# Patient Record
Sex: Male | Born: 1970 | Race: White | Hispanic: No | Smoking: Never smoker
Health system: Southern US, Community
[De-identification: ages and names within clinical notes are randomized; demographics above are authoritative.]

## PROBLEM LIST (undated history)

## (undated) DIAGNOSIS — G4730 Sleep apnea, unspecified: Secondary | ICD-10-CM

## (undated) HISTORY — DX: Sleep apnea, unspecified: G47.30

---

## 2021-01-16 ENCOUNTER — Other Ambulatory Visit: Payer: Self-pay

## 2021-01-16 ENCOUNTER — Telehealth (INDEPENDENT_AMBULATORY_CARE_PROVIDER_SITE_OTHER): Payer: 59 | Admitting: Psychiatry

## 2021-01-16 ENCOUNTER — Encounter (HOSPITAL_COMMUNITY): Payer: Self-pay | Admitting: Psychiatry

## 2021-01-16 DIAGNOSIS — F411 Generalized anxiety disorder: Secondary | ICD-10-CM | POA: Diagnosis not present

## 2021-01-16 DIAGNOSIS — F331 Major depressive disorder, recurrent, moderate: Secondary | ICD-10-CM

## 2021-01-16 MED ORDER — SERTRALINE HCL 100 MG PO TABS: ORAL_TABLET | ORAL | 0 refills | Status: DC

## 2021-01-16 NOTE — Progress Notes (Signed)
Psychiatric Initial Adult Assessment   Patient Identification: Theodore [name] MRN:  [id number] Date of Evaluation:  01/16/2021 Referral Source: [facility] Chief Complaint:  establish care, depression Visit Diagnosis:    ICD-10-CM   1. MDD (major depressive disorder), recurrent episode, moderate (HCC)  F33.1   2. GAD (generalized anxiety disorder)  F41.1   Virtual Visit via Video Note  I connected with [name] on 01/16/21 at  9:00 AM EST by a video enabled telemedicine application and verified that I am speaking with the correct person using two identifiers.  Location: Patient: parked car Provider: office   I discussed the limitations of evaluation and management by telemedicine and the availability of in person appointments. The patient expressed understanding and agreed to proceed.   I discussed the assessment and treatment plan with the patient. The patient was provided an opportunity to ask questions and all were answered. The patient agreed with the plan and demonstrated an understanding of the instructions.   The patient was advised to call back or seek an in-person evaluation if the symptoms worsen or if the condition fails to improve as anticipated.  I provided 40  minutes of non-face-to-face time during this encounter.   Thresa Ross, MD   History of Present Illness: Patient is a 50 years old currently single Caucasian male referred by last psychiatrist or [facility] Dr Nila Nephew as he has private insurance now and could not [facility].  He has been diagnosed with depression and anxiety has been on sertraline at a dose of 150 mg.  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  He has worked in the past as a Engineer, mining episodes of depression starting at age 89 or 52 when he was bullied and they have relocated from  [place] to [place] said that he was poor and had a difficult time in school.  He does have anxiety panic attacks when younger.  He has been following up with his psychiatrist and has been doing reasonable with the current medication of sertraline  He does endorse episodes of depression feeling down withdrawn decreased interest in things.  He is trying to be motivated and is going to the gym trying to spend time with his drums and music keeping himself motivated he has also lost more than 10 pounds in the last 2 months by cutting down on his alcohol and working out in the gym Is also noticed memory concerns or sometimes forgets his ways or feels that he has issues recalling.  He has cut down his alcohol intake significantly in the last 2 months.  He plans to follow with the primary care physician and the neurologist.  States his mom has had early dementia. Otherwise he manages his day-to-day routine and things reasonable Anxiety wise he still worries about his future worries about finances worries about still being single he dwells on the past including past memories and traumatizing memories.  He is seeing a therapist at [facility] where he is doing education He does not endorse psychotic symptoms delusions hallucinations.  He is cautious about people and takes time to trust them Does not endorse manic symptoms.  He feels medication sertraline 50 mg during the day and 1 mg does help him he does not need trazodone for sleep he feels medication is keeping some balance  and he is trying to remain motivated to keep himself busy and engaged in activities   Aggravating factor: difficult childood, lost BF when young , lost mom 5 years ago Modifying factor: dad, gym music, drums, music  Duration since young age of 89 - 30   Past psych admission denies   Past Psychiatric History: depression, anxiety  Previous Psychotropic Medications: No   Substance Abuse History in the last 12 months:  Yes.     Consequences of Substance Abuse: have cut down alcohol, understands its effect on mood, memory, depression  Past Medical History: History reviewed. No pertinent past medical history. History reviewed. No pertinent surgical history.  Family Psychiatric History: mom : depression, dementia  Family History: History reviewed. No pertinent family history.  Social History:   Social History   Socioeconomic History  . Marital status: Unknown    Spouse name: Not on file  . Number of children: Not on file  . Years of education: Not on file  . Highest education level: Not on file  Occupational History  . Not on file  Tobacco Use  . Smoking status: Not on file  . Smokeless tobacco: Not on file  Substance and Sexual Activity  . Alcohol use: Not on file  . Drug use: Not on file  . Sexual activity: Not on file  Other Topics Concern  . Not on file  Social History Narrative  . Not on file   Social Determinants of Health   Financial Resource Strain: Not on file  Food Insecurity: Not on file  Transportation Needs: Not on file  Physical Activity: Not on file  Stress: Not on file  Social Connections: Not on file    Additional Social History: grew up with parents, only kid, difficult in school due to bullying and lost friend when young relcoation from [place] growing up.  Allergies:  No Known Allergies  Metabolic Disorder Labs: No results found for: HGBA1C, MPG No results found for: PROLACTIN No results found for: CHOL, TRIG, HDL, CHOLHDL, VLDL, LDLCALC No results found for: TSH  Therapeutic Level Labs: No results found for: LITHIUM No results found for: CBMZ No results found for: VALPROATE  Current Medications: Current Outpatient Medications  Medication Sig Dispense Refill  . traZODone (DESYREL) 50 MG tablet take 1 tablet by oral route  every bedtime as needed for sleep.    . Multiple Vitamin (MULTI-VITAMIN) tablet     . sertraline (ZOLOFT) 100 MG tablet take 1 and 1/2  tablets by oral route  every day 45 tablet 0   No current facility-administered medications for this visit.     Psychiatric Specialty Exam: Review of Systems  Cardiovascular: Negative for chest pain.  Psychiatric/Behavioral: Positive for decreased concentration. Negative for agitation and self-injury.    There were no vitals taken for this visit.There is no height or weight on file to calculate BMI.  General Appearance: Casual  Eye Contact:  Fair  Speech:  Normal Rate  Volume:  Decreased  Mood:  Euthymic  Affect:  Constricted  Thought Process:  Goal Directed  Orientation:  Full (Time, Place, and Person)  Thought Content:  Rumination  Suicidal Thoughts:  No  Homicidal Thoughts:  No  Memory:  Immediate;   Fair Recent;   Fair  Judgement:  Fair  Insight:  Fair  Psychomotor Activity:  Normal  Concentration:  Concentration: Fair and Attention Span: Fair  Recall:  [employer] of Knowledge:Good  Language: Good  Akathisia:  No  Handed:  AIMS (if indicated):  not done  Assets:  Desire for Improvement  ADL's:  Intact  Cognition: WNL  Sleep:  Fair   Screenings: PHQ2-9   Flowsheet Row Video Visit from 01/16/2021 in [facility] AT [place]  PHQ-2 Total Score 1    Flowsheet Row Video Visit from 01/16/2021 in [facility] AT [place]  C-SSRS RISK CATEGORY No Risk      Assessment and Plan: as follows  MDD recurrent moderate :  Continue sertraline at the current dose refill sent  Generalized anxiety disorder; continue sertraline discussed to distract him engage in activities.  He is also seeing a therapist that [facility]  He was will follow up with a neurologist and primary care physician in regards to concern about memory.  He has cut down alcohol and does feel that for the last 1 month he is more alert and has lost weight confidence level is better as well  Follow-up in 3 to 4 weeks or earlier if needed    Thresa Ross,  MD 3/1/20229:44 AM

## 2021-01-19 ENCOUNTER — Ambulatory Visit: Payer: Self-pay | Admitting: Family Medicine

## 2021-01-25 ENCOUNTER — Ambulatory Visit (INDEPENDENT_AMBULATORY_CARE_PROVIDER_SITE_OTHER): Payer: 59 | Admitting: Family Medicine

## 2021-01-25 ENCOUNTER — Encounter: Payer: Self-pay | Admitting: Family Medicine

## 2021-01-25 ENCOUNTER — Other Ambulatory Visit: Payer: Self-pay

## 2021-01-25 VITALS — BP 124/58 | HR 82 | Ht 68.0 in | Wt 191.0 lb

## 2021-01-25 DIAGNOSIS — F419 Anxiety disorder, unspecified: Secondary | ICD-10-CM

## 2021-01-25 DIAGNOSIS — Z23 Encounter for immunization: Secondary | ICD-10-CM

## 2021-01-25 DIAGNOSIS — N528 Other male erectile dysfunction: Secondary | ICD-10-CM

## 2021-01-25 DIAGNOSIS — Z0000 Encounter for general adult medical examination without abnormal findings: Secondary | ICD-10-CM

## 2021-01-25 DIAGNOSIS — Z7689 Persons encountering health services in other specified circumstances: Secondary | ICD-10-CM

## 2021-01-25 DIAGNOSIS — R5383 Other fatigue: Secondary | ICD-10-CM

## 2021-01-25 LAB — POCT GLYCOSYLATED HEMOGLOBIN (HGB A1C): Hemoglobin A1C: 4.8 % (ref 4.0–5.6)

## 2021-01-25 NOTE — Progress Notes (Signed)
    SUBJECTIVE:   CHIEF COMPLAINT / HPI: Establish care  Patient here to establish care.  Indicates has main concern that he is feeling more fatigue and not does not have as much stamina as in previous years.  Indicates he goes to the gym frequently 3-4 times a week.  Indicates trouble establishing a relationship and has some anxiety/depression regarding this.  Currently has psychiatrist and therapist who he indicates are helping manage this.  Discussed concerns with reltionship concerns with therapist.  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  Previously was drinking 6+ beers a night, but has made adjustments in past few months and now only drinks 1-2 beers a night at most in step to becoming healthier.  Denies carvings for more or difficulties with cutting back.  PERTINENT  PMH / PSH:   OBJECTIVE:   BP (!) 124/58   Pulse 82   Ht 5\' 8"  (1.727 m)   Wt 191 lb (86.6 kg)   SpO2 97%   BMI 29.04 kg/m    Physical Exam Constitutional:      General: He is not in acute distress.    Appearance: Normal appearance. He is not ill-appearing.  HENT:     Head: Normocephalic and atraumatic.     Mouth/Throat:     Mouth: Mucous membranes are moist.  Cardiovascular:     Rate and Rhythm: Normal rate and regular rhythm.     Pulses: Normal pulses.  Pulmonary:     Effort: Pulmonary effort is normal.     Breath sounds: Normal breath sounds.  Abdominal:     General: Abdomen is flat.     Palpations: Abdomen is soft.  Skin:    General: Skin is warm.  Neurological:     General: No focal deficit present.     Mental Status: He is alert and oriented to person, place, and time.     ASSESSMENT/PLAN:   Encounter to establish care Patient has not seen doctor in several years.  Plan to obtain labs today. Patient also expresses desire to work on losing weight and becoming healthier.  Praised patient for current major steps taken already including frequent exercise and  decreasing drinking.   - Obtain CMP, Lipid panel, A1C, Hep C, HIV - Give flu vaccine - follow up in 1 month for future health goals  Fatigue Expresses this along with some confusion and erectile dysfunction.  Discussed with patient a component of this was likely due to aging.  May also be due to depression/anxiety history of alcohol use.  Will obtain CBC, TSH, and Testosterone to rule out other medical causes. - Obtain TSH, CBC, and Testosterone - follow up in 1 month    Depression/Anxiety Denies any SI.  Has Therapist and Psychiatrist who he sees for management.  Currently on Zoloft 100 mg daily. - Continue Zoloft 100 mg qd - Continue follow up with Therapist/Psychiatrist  , MD [facility]

## 2021-01-25 NOTE — Patient Instructions (Signed)
It was good to see you today.  Thank you for coming in.  I am getting labs to check your blood sugar, cholesterol, blood levels, thyroid function, electrolytes, hep c, hiv, and testosterone.  I will call you with any abnormal results.  Please get your COVID Moderna booster at a Pharmacy.  I would like to see you back in 1 month.  Be Well, Dr Pecola Leisure

## 2021-01-26 LAB — CBC
Hematocrit: 46.6 % (ref 37.5–51.0)
Hemoglobin: 15.9 g/dL (ref 13.0–17.7)
MCH: 32.5 pg (ref 26.6–33.0)
MCHC: 34.1 g/dL (ref 31.5–35.7)
MCV: 95 fL (ref 79–97)
Platelets: 214 10*3/uL (ref 150–450)
RBC: 4.89 x10E6/uL (ref 4.14–5.80)
RDW: 11.5 % — ABNORMAL LOW (ref 11.6–15.4)
WBC: 6.1 10*3/uL (ref 3.4–10.8)

## 2021-01-26 LAB — LIPID PANEL
Chol/HDL Ratio: 4.2 ratio (ref 0.0–5.0)
Cholesterol, Total: 212 mg/dL — ABNORMAL HIGH (ref 100–199)
HDL: 51 mg/dL (ref >39)
LDL Chol Calc (NIH): 129 mg/dL — ABNORMAL HIGH (ref 0–99)
Triglycerides: 179 mg/dL — ABNORMAL HIGH (ref 0–149)
VLDL Cholesterol Cal: 32 mg/dL (ref 5–40)

## 2021-01-26 LAB — COMPREHENSIVE METABOLIC PANEL
ALT: 30 IU/L (ref 0–44)
AST: 20 IU/L (ref 0–40)
Albumin/Globulin Ratio: 2 (ref 1.2–2.2)
Albumin: 4.5 g/dL (ref 4.0–5.0)
Alkaline Phosphatase: 57 IU/L (ref 44–121)
BUN/Creatinine Ratio: 16 (ref 9–20)
BUN: 14 mg/dL (ref 6–24)
Bilirubin Total: 0.4 mg/dL (ref 0.0–1.2)
CO2: 23 mmol/L (ref 20–29)
Calcium: 9.6 mg/dL (ref 8.7–10.2)
Chloride: 102 mmol/L (ref 96–106)
Creatinine, Ser: 0.89 mg/dL (ref 0.76–1.27)
Globulin, Total: 2.2 g/dL (ref 1.5–4.5)
Glucose: 88 mg/dL (ref 65–99)
Potassium: 4.9 mmol/L (ref 3.5–5.2)
Sodium: 139 mmol/L (ref 134–144)
Total Protein: 6.7 g/dL (ref 6.0–8.5)
eGFR: 106 mL/min/{1.73_m2} (ref >59)

## 2021-01-26 LAB — TSH: TSH: 2.83 u[IU]/mL (ref 0.450–4.500)

## 2021-01-26 LAB — HIV ANTIBODY (ROUTINE TESTING W REFLEX): HIV Screen 4th Generation wRfx: NONREACTIVE

## 2021-01-26 LAB — TESTOSTERONE: Testosterone: 321 ng/dL (ref 264–916)

## 2021-01-29 DIAGNOSIS — R5383 Other fatigue: Secondary | ICD-10-CM | POA: Insufficient documentation

## 2021-01-29 DIAGNOSIS — F419 Anxiety disorder, unspecified: Secondary | ICD-10-CM | POA: Insufficient documentation

## 2021-01-29 DIAGNOSIS — Z7689 Persons encountering health services in other specified circumstances: Secondary | ICD-10-CM | POA: Insufficient documentation

## 2021-01-29 NOTE — Assessment & Plan Note (Signed)
Patient has not seen doctor in several years.  Plan to obtain labs today. Patient also expresses desire to work on losing weight and becoming healthier.  Praised patient for current major steps taken already including frequent exercise and decreasing drinking.   - Obtain CMP, Lipid panel, A1C, Hep C, HIV - Give flu vaccine - follow up in 1 month for future health goals

## 2021-01-29 NOTE — Assessment & Plan Note (Signed)
Expresses this along with some confusion and erectile dysfunction.  Discussed with patient a component of this was likely due to aging.  May also be due to depression/anxiety history of alcohol use.  Will obtain CBC, TSH, and Testosterone to rule out other medical causes. - Obtain TSH, CBC, and Testosterone - follow up in 1 month

## 2021-02-19 ENCOUNTER — Telehealth (INDEPENDENT_AMBULATORY_CARE_PROVIDER_SITE_OTHER): Payer: 59 | Admitting: Psychiatry

## 2021-02-19 ENCOUNTER — Encounter (HOSPITAL_COMMUNITY): Payer: Self-pay | Admitting: Psychiatry

## 2021-02-19 DIAGNOSIS — Z7289 Other problems related to lifestyle: Secondary | ICD-10-CM

## 2021-02-19 DIAGNOSIS — F331 Major depressive disorder, recurrent, moderate: Secondary | ICD-10-CM

## 2021-02-19 DIAGNOSIS — Z789 Other specified health status: Secondary | ICD-10-CM

## 2021-02-19 DIAGNOSIS — F411 Generalized anxiety disorder: Secondary | ICD-10-CM

## 2021-02-19 MED ORDER — TRAZODONE HCL 50 MG PO TABS: ORAL_TABLET | ORAL | 0 refills | Status: DC

## 2021-02-19 MED ORDER — SERTRALINE HCL 100 MG PO TABS: ORAL_TABLET | ORAL | 1 refills | Status: DC

## 2021-02-19 NOTE — Progress Notes (Signed)
[facility] Follow up visit  Patient Identification: Theodore [name] MRN:  [id number] Date of Evaluation:  02/19/2021 Referral Source: [facility] Chief Complaint: follow up depression Visit Diagnosis:    ICD-10-CM   1. MDD (major depressive disorder), recurrent episode, moderate (HCC)  F33.1   2. GAD (generalized anxiety disorder)  F41.1   3. Alcohol use  Z72.89    Virtual Visit via Video Note  I connected with Theodore [name] on 02/19/21 at  8:30 AM EDT by a video enabled telemedicine application and verified that I am speaking with the correct person using two identifiers.  Location: Patient: home Provider: home office   I discussed the limitations of evaluation and management by telemedicine and the availability of in person appointments. The patient expressed understanding and agreed to proceed.     I discussed the assessment and treatment plan with the patient. The patient was provided an opportunity to ask questions and all were answered. The patient agreed with the plan and demonstrated an understanding of the instructions.   The patient was advised to call back or seek an in-person evaluation if the symptoms worsen or if the condition fails to improve as anticipated.  I provided 20 minutes of non-face-to-face time during this encounter, including chart review and documentation     History of Present Illness: Patient is a 50 years old currently single Caucasian male initially referred by last psychiatrist or [facility] Dr Theodore [name] as he has private insurance now and could not [facility].  He has been diagnosed with depression and anxiety has been on sertraline at a dose of 150 mg.   Doing fair on sertraline some foggines wants to lower amount Have cut down alcohol as we discussed its effect on memory  Working and student at [employer]     Aggravating factor: difficult childood, lost BF when young , lost mom 5 years ago Modifying factor: dad, music, drums   Past Psychiatric  History: depression, anxiety  Previous Psychotropic Medications: No   Substance Abuse History in the last 12 months:  Yes.    Consequences of Substance Abuse: have cut down alcohol, understands its effect on mood, memory, depression  Past Medical History: No past medical history on file. No past surgical history on file.  Family Psychiatric History: mom : depression, dementia  Family History: No family history on file.  Social History:   Social History   Socioeconomic History  . Marital status: Unknown    Spouse name: Not on file  . Number of children: Not on file  . Years of education: Not on file  . Highest education level: Not on file  Occupational History  . Not on file  Tobacco Use  . Smoking status: Never Smoker  . Smokeless tobacco: Never Used  Substance and Sexual Activity  . Alcohol use: Not on file  . Drug use: Not on file  . Sexual activity: Not on file  Other Topics Concern  . Not on file  Social History Narrative  . Not on file   Social Determinants of Health   Financial Resource Strain: Not on file  Food Insecurity: Not on file  Transportation Needs: Not on file  Physical Activity: Not on file  Stress: Not on file  Social Connections: Not on file      Allergies:  No Known Allergies  Metabolic Disorder Labs: Lab Results  Component Value Date   HGBA1C 4.8 01/25/2021   No results found for: PROLACTIN Lab Results  Component Value Date  CHOL 212 (H) 01/25/2021   TRIG 179 (H) 01/25/2021   HDL 51 01/25/2021   CHOLHDL 4.2 01/25/2021   LDLCALC 129 (H) 01/25/2021   Lab Results  Component Value Date   TSH 2.830 01/25/2021    Therapeutic Level Labs: No results found for: LITHIUM No results found for: CBMZ No results found for: VALPROATE  Current Medications: Current Outpatient Medications  Medication Sig Dispense Refill  . Multiple Vitamin (MULTI-VITAMIN) tablet     . sertraline (ZOLOFT) 100 MG tablet Take half bid 30 tablet 1  .  traZODone (DESYREL) 50 MG tablet take 1 tablet by oral route  every bedtime as needed for sleep. 30 tablet 0   No current facility-administered medications for this visit.     Psychiatric Specialty Exam: Review of Systems  Cardiovascular: Negative for chest pain.  Psychiatric/Behavioral: Positive for decreased concentration. Negative for agitation and self-injury.    There were no vitals taken for this visit.There is no height or weight on file to calculate BMI.  General Appearance: Casual  Eye Contact:  Fair  Speech:  Normal Rate  Volume:  Decreased  Mood:  Euthymic  Affect:  Constricted  Thought Process:  Goal Directed  Orientation:  Full (Time, Place, and Person)  Thought Content:  Rumination  Suicidal Thoughts:  No  Homicidal Thoughts:  No  Memory:  Immediate;   Fair Recent;   Fair  Judgement:  Fair  Insight:  Fair  Psychomotor Activity:  Normal  Concentration:  Concentration: Fair and Attention Span: Fair  Recall:  [employer] of Knowledge:Good  Language: Good  Akathisia:  No  Handed:    AIMS (if indicated):  not done  Assets:  Desire for Improvement  ADL's:  Intact  Cognition: WNL  Sleep:  Fair   Screenings: PHQ2-9   Flowsheet Row Office Visit from 01/25/2021 in [facility] Video Visit from 01/16/2021 in [facility] AT [place]  PHQ-2 Total Score 4 1  PHQ-9 Total Score 18 --    Flowsheet Row Video Visit from 02/19/2021 in [facility] AT [place] Video Visit from 01/16/2021 in [facility] AT [place]  C-SSRS RISK CATEGORY No Risk No Risk      Assessment and Plan: as follows  MDD recurrent moderate : lower zoloft to 100mg , to see effect on fogginess    Generalized anxiety disorder; also sees therapist at [facility], lower zoloft to 100mg  Discussed to abstain from alcohol and its effect on memory   Fu 6 - 8w     , MD 4/4/20228:47 AM

## 2021-04-10 ENCOUNTER — Telehealth (HOSPITAL_COMMUNITY): Payer: Self-pay

## 2021-04-10 NOTE — Telephone Encounter (Signed)
left for patient that this nurse was trying to call them back after message left. Requested he call our office back to follow up on any concerns.

## 2021-04-10 NOTE — Telephone Encounter (Signed)
Medication management - Telephone call with patient as he was requesting a refill of Sertraline.  Informed patient a new order was e-scribed into his [facility] on 02/19/21 + 1 refill.  Collateral to call back if they do not have the order as he admitted he called in a refill off of an expired bottle order.

## 2021-04-23 ENCOUNTER — Encounter (HOSPITAL_COMMUNITY): Payer: Self-pay | Admitting: Psychiatry

## 2021-04-23 ENCOUNTER — Telehealth (INDEPENDENT_AMBULATORY_CARE_PROVIDER_SITE_OTHER): Payer: 59 | Admitting: Psychiatry

## 2021-04-23 DIAGNOSIS — F331 Major depressive disorder, recurrent, moderate: Secondary | ICD-10-CM

## 2021-04-23 DIAGNOSIS — F411 Generalized anxiety disorder: Secondary | ICD-10-CM | POA: Diagnosis not present

## 2021-04-23 MED ORDER — SERTRALINE HCL 100 MG PO TABS: ORAL_TABLET | ORAL | 1 refills | Status: DC

## 2021-04-23 NOTE — Progress Notes (Signed)
[facility] Follow up visit  Patient Identification: Theodore [name] MRN:  [id number] Date of Evaluation:  04/23/2021 Referral Source: [facility] Chief Complaint: follow up depression Visit Diagnosis:    ICD-10-CM   1. MDD (major depressive disorder), recurrent episode, moderate (HCC)  F33.1   2. GAD (generalized anxiety disorder)  F41.1    Virtual Visit via Video Note  I connected with Theodore [name] on 04/23/21 at 10:30 AM EDT by a video enabled telemedicine application and verified that I am speaking with the correct person using two identifiers.  Location: Patient: home Provider: home office   I discussed the limitations of evaluation and management by telemedicine and the availability of in person appointments. The patient expressed understanding and agreed to proceed.     I discussed the assessment and treatment plan with the patient. The patient was provided an opportunity to ask questions and all were answered. The patient agreed with the plan and demonstrated an understanding of the instructions.   The patient was advised to call back or seek an in-person evaluation if the symptoms worsen or if the condition fails to improve as anticipated.  I provided 15  minutes of non-face-to-face time during this encounter.       History of Present Illness: Patient is a 50  years old currently single Caucasian male initially referred by last psychiatrist or [facility] Nila Nephew as he has private insurance now and could not [facility].  He has been diagnosed with depression and anxiety has been on sertraline at a dose of 150 mg.    Doing fair on sertraline now at 100mg  He tries to help out his dad who was sick He keeps himself busy with his mid eardrum room and plays music Was having fogginess on sertraline he stopped it and felt that he needed to get back because of depression or mood symptoms came back Anxiety is also manageable now better on the 100 mg he seldom takes trazodone at  night  He has cut down alcohol and substituting with water and green tea that is helping   Working and at Consulting civil [employer]     Aggravating factor: difficult childood, lost BF when young , lost mom 5 years ago Modifying factor: Dad, music, grams   Past Psychiatric History: depression, anxiety  Previous Psychotropic Medications: No   Substance Abuse History in the last 12 months:  Yes.    Consequences of Substance Abuse: have cut down alcohol, understands its effect on mood, memory, depression  Past Medical History: History reviewed. No pertinent past medical history. History reviewed. No pertinent surgical history.  Family Psychiatric History: mom : depression, dementia  Family History: History reviewed. No pertinent family history.  Social History:   Social History   Socioeconomic History  . Marital status: Unknown    Spouse name: Not on file  . Number of children: Not on file  . Years of education: Not on file  . Highest education level: Not on file  Occupational History  . Not on file  Tobacco Use  . Smoking status: Never Smoker  . Smokeless tobacco: Never Used  Substance and Sexual Activity  . Alcohol use: Not on file  . Drug use: Not on file  . Sexual activity: Not on file  Other Topics Concern  . Not on file  Social History Narrative  . Not on file   Social Determinants of Health   Financial Resource Strain: Not on file  Food Insecurity: Not on file  Transportation  Needs: Not on file  Physical Activity: Not on file  Stress: Not on file  Social Connections: Not on file      Allergies:  No Known Allergies  Metabolic Disorder Labs: Lab Results  Component Value Date   HGBA1C 4.8 01/25/2021   No results found for: PROLACTIN Lab Results  Component Value Date   CHOL 212 (H) 01/25/2021   TRIG 179 (H) 01/25/2021   HDL 51 01/25/2021   CHOLHDL 4.2 01/25/2021   LDLCALC 129 (H) 01/25/2021   Lab Results  Component Value Date   TSH 2.830 01/25/2021     Therapeutic Level Labs: No results found for: LITHIUM No results found for: CBMZ No results found for: VALPROATE  Current Medications: Current Outpatient Medications  Medication Sig Dispense Refill  . Multiple Vitamin (MULTI-VITAMIN) tablet     . sertraline (ZOLOFT) 100 MG tablet Take half bid 30 tablet 1  . traZODone (DESYREL) 50 MG tablet take 1 tablet by oral route  every bedtime as needed for sleep. 30 tablet 0   No current facility-administered medications for this visit.     Psychiatric Specialty Exam: Review of Systems  Cardiovascular: Negative for chest pain.  Psychiatric/Behavioral: Negative for agitation and self-injury.    There were no vitals taken for this visit.There is no height or weight on file to calculate BMI.  General Appearance: Casual  Eye Contact:  Fair  Speech:  Normal Rate  Volume:  Decreased  Mood:  Euthymic  Affect:  Constricted  Thought Process:  Goal Directed  Orientation:  Full (Time, Place, and Person)  Thought Content:  Rumination  Suicidal Thoughts:  No  Homicidal Thoughts:  No  Memory:  Immediate;   Fair Recent;   Fair  Judgement:  Fair  Insight:  Fair  Psychomotor Activity:  Normal  Concentration:  Concentration: Fair and Attention Span: Fair  Recall:  [employer] of Knowledge:Good  Language: Good  Akathisia:  No  Handed:    AIMS (if indicated):  not done  Assets:  Desire for Improvement  ADL's:  Intact  Cognition: WNL  Sleep:  Fair   Screenings: PHQ2-9   Flowsheet Row Office Visit from 01/25/2021 in [facility] Video Visit from 01/16/2021 in [facility] AT [place]  PHQ-2 Total Score 4 1  PHQ-9 Total Score 18 --    Flowsheet Row Video Visit from 04/23/2021 in [facility] AT [place] Video Visit from 02/19/2021 in [facility] AT [place] Video Visit from 01/16/2021 in [facility] CENTER AT [place]   C-SSRS RISK CATEGORY No Risk No Risk No Risk      Assessment and Plan: as follows  MDD recurrent moderate : Doing fair on 100 mg sertraline     Generalized anxiety disorder; manageable on sertraline working on healthy habits like drinking green tea and water instead of alcohol Seldom takes trazodone anymore   Fu 6 - 8w     Thresa Ross, MD 6/6/202210:45 AM

## 2021-06-04 ENCOUNTER — Ambulatory Visit (INDEPENDENT_AMBULATORY_CARE_PROVIDER_SITE_OTHER): Payer: 59 | Admitting: Family Medicine

## 2021-06-04 ENCOUNTER — Other Ambulatory Visit: Payer: Self-pay

## 2021-06-04 ENCOUNTER — Encounter: Payer: Self-pay | Admitting: Family Medicine

## 2021-06-04 VITALS — BP 120/64 | HR 81 | Ht 68.0 in | Wt 197.1 lb

## 2021-06-04 DIAGNOSIS — E785 Hyperlipidemia, unspecified: Secondary | ICD-10-CM

## 2021-06-04 DIAGNOSIS — R5383 Other fatigue: Secondary | ICD-10-CM | POA: Diagnosis not present

## 2021-06-04 DIAGNOSIS — E1169 Type 2 diabetes mellitus with other specified complication: Secondary | ICD-10-CM

## 2021-06-04 DIAGNOSIS — F419 Anxiety disorder, unspecified: Secondary | ICD-10-CM | POA: Diagnosis not present

## 2021-06-04 DIAGNOSIS — G4739 Other sleep apnea: Secondary | ICD-10-CM

## 2021-06-04 MED ORDER — MELATONIN 3 MG PO TABS: 3.0000 mg | ORAL_TABLET | Freq: Every day | ORAL | 0 refills | Status: DC

## 2021-06-04 NOTE — Patient Instructions (Addendum)
It was good to see you today.  Thank you for coming in.  I think you may have sleep apnea.  I placed a referral for a sleep study.  They should reach out to you in 1-2 weeks for this study.  I also recommend a Melatonin supplement before bed.  Please continue to take Zoloft 150 mg and follow-up with your therapist and psychiatrist.   Be Well, Dr Pecola Leisure

## 2021-06-05 DIAGNOSIS — E1169 Type 2 diabetes mellitus with other specified complication: Secondary | ICD-10-CM | POA: Insufficient documentation

## 2021-06-05 NOTE — Assessment & Plan Note (Signed)
Sees psychiatrist and therapist for regularly. - Continue Zoloft 150 mg

## 2021-06-05 NOTE — Assessment & Plan Note (Signed)
LDL elevated at 130.  Given patient's normal blood pressure and normal A1C, do not feel statin therpay is indicated.  Continue to work on [employer] and exercise. - Repeat lipid panel in 1 year

## 2021-06-05 NOTE — Progress Notes (Signed)
    SUBJECTIVE:   CHIEF COMPLAINT / HPI:   Patient having issues with sleep.  Also wakes up feeling fatigued and like he has not had good night sleep.  Seen previously for previous problem and lab work returned normal.  Feel like has gotten worse since then.  Also recently spoke to psychiatrist who increased Zoloft to 150 mg.  Indicates has been told he snores very loudly before.  PERTINENT  PMH / PSH: Hyperlipidemia  OBJECTIVE:   BP 120/64   Pulse 81   Ht 5\' 8"  (1.727 m)   Wt 197 lb 2 oz (89.4 kg)   SpO2 98%   BMI 29.97 kg/m    Physical Exam Constitutional:      Appearance: Normal appearance.  HENT:     Mouth/Throat:     Mouth: Mucous membranes are moist.  Cardiovascular:     Rate and Rhythm: Normal rate and regular rhythm.  Pulmonary:     Effort: Pulmonary effort is normal.  Abdominal:     General: Abdomen is flat. Bowel sounds are normal.     Palpations: Abdomen is soft.  Skin:    General: Skin is warm.  Neurological:     General: No focal deficit present.     Mental Status: He is alert.     ASSESSMENT/PLAN:   Fatigue TSH, Hgb, Testosterone all normal.  Given issues with sleep and history of loud snoring, do think sleep study would be appropriate.  Could also be due to anxiety.  Has therapist and psychiatrist who he sees regularly and recently increased to 150 mg Zoloft. - Referral placed for sleep study - Melatonin 3 mg qhs - follow-up as needed  Anxiety Sees psychiatrist and therapist for regularly. - Continue Zoloft 150 mg  Hyperlipidemia associated with type 2 diabetes mellitus (HCC) LDL elevated at 130.  Given patient's normal blood pressure and normal A1C, do not feel statin therpay is indicated.  Continue to work on and exercise. - Repeat lipid panel in 1 year     [employer], MD [facility]

## 2021-06-05 NOTE — Assessment & Plan Note (Addendum)
TSH, Hgb, Testosterone all normal.  Given issues with sleep and history of loud snoring, do think sleep study would be appropriate.  Could also be due to anxiety.  Has therapist and psychiatrist who he sees regularly and recently increased to 150 mg Zoloft. - Referral placed for sleep study - Melatonin 3 mg qhs - follow-up as needed

## 2021-06-07 NOTE — Addendum Note (Signed)
Addended by: Jovita Kussmaul on: 06/07/2021 02:32 PM   Modules accepted: Orders

## 2021-07-02 ENCOUNTER — Telehealth (HOSPITAL_COMMUNITY): Payer: 59 | Admitting: Psychiatry

## 2021-08-03 ENCOUNTER — Other Ambulatory Visit (HOSPITAL_COMMUNITY): Payer: Self-pay | Admitting: Psychiatry

## 2021-08-20 ENCOUNTER — Encounter: Payer: Self-pay | Admitting: Student

## 2021-08-27 ENCOUNTER — Other Ambulatory Visit (HOSPITAL_COMMUNITY): Payer: Self-pay

## 2021-08-27 ENCOUNTER — Other Ambulatory Visit: Payer: Self-pay

## 2021-08-27 ENCOUNTER — Other Ambulatory Visit (HOSPITAL_COMMUNITY): Payer: Self-pay | Admitting: Psychiatry

## 2021-08-27 ENCOUNTER — Ambulatory Visit (HOSPITAL_BASED_OUTPATIENT_CLINIC_OR_DEPARTMENT_OTHER): Payer: 59 | Attending: Family Medicine | Admitting: Internal Medicine

## 2021-08-27 VITALS — Ht 68.0 in | Wt 190.0 lb

## 2021-08-27 DIAGNOSIS — G4733 Obstructive sleep apnea (adult) (pediatric): Secondary | ICD-10-CM | POA: Diagnosis not present

## 2021-08-27 DIAGNOSIS — G4739 Other sleep apnea: Secondary | ICD-10-CM | POA: Diagnosis present

## 2021-08-27 MED ORDER — SERTRALINE HCL 100 MG PO TABS: ORAL_TABLET | ORAL | 0 refills | Status: DC

## 2021-08-28 ENCOUNTER — Telehealth (INDEPENDENT_AMBULATORY_CARE_PROVIDER_SITE_OTHER): Payer: 59 | Admitting: Psychiatry

## 2021-08-28 ENCOUNTER — Encounter (HOSPITAL_COMMUNITY): Payer: Self-pay | Admitting: Psychiatry

## 2021-08-28 DIAGNOSIS — F1090 Alcohol use, unspecified, uncomplicated: Secondary | ICD-10-CM

## 2021-08-28 DIAGNOSIS — F331 Major depressive disorder, recurrent, moderate: Secondary | ICD-10-CM

## 2021-08-28 DIAGNOSIS — Z789 Other specified health status: Secondary | ICD-10-CM

## 2021-08-28 DIAGNOSIS — F411 Generalized anxiety disorder: Secondary | ICD-10-CM

## 2021-08-28 NOTE — Progress Notes (Signed)
[facility] Follow up visit  Patient Identification: Theodore [name] MRN:  [id number] Date of Evaluation:  08/28/2021 Referral Source: [facility] Chief Complaint: follow up depression Visit Diagnosis:    ICD-10-CM   1. MDD (major depressive disorder), recurrent episode, moderate (HCC)  F33.1     2. GAD (generalized anxiety disorder)  F41.1     3. Alcohol use  Z78.9      Virtual Visit via Video Note  I connected with Theodore [name] on 08/28/21 at  3:00 PM EDT by a video enabled telemedicine application and verified that I am speaking with the correct person using two identifiers.  Location: Patient: home Provider: office   I discussed the limitations of evaluation and management by telemedicine and the availability of in person appointments. The patient expressed understanding and agreed to proceed.     I discussed the assessment and treatment plan with the patient. The patient was provided an opportunity to ask questions and all were answered. The patient agreed with the plan and demonstrated an understanding of the instructions.   The patient was advised to call back or seek an in-person evaluation if the symptoms worsen or if the condition fails to improve as anticipated.  I provided 12 minutes of non-face-to-face time during this encounter.      History of Present Illness: Patient is a 50  years old currently single Caucasian male initially referred by last psychiatrist or [facility] Dr Theodore [name] as he has private insurance now and [facility].  He has been diagnosed with depression and anxiety has been on sertraline at a dose of 150 mg.    Keeping busy with music and gigs. Takes care of father Some increase stressors but handling it Got cpap study done and pending machine use  Takes trazadone at night prn  Says has cut down alcohol and not using it any more   Working and Consulting civil engineer at [employer]   Aggravating factor: difficult childood, lost BF when young , lost mom 5  years ago Modifying factor: Dad, music, drums   Past Psychiatric History: depression, anxiety  Previous Psychotropic Medications: No   Substance Abuse History in the last 12 months:  Yes.    Consequences of Substance Abuse: have cut down alcohol, understands its effect on mood, memory, depression  Past Medical History: History reviewed. No pertinent past medical history. History reviewed. No pertinent surgical history.  Family Psychiatric History: mom : depression, dementia  Family History: History reviewed. No pertinent family history.  Social History:   Social History   Socioeconomic History   Marital status: Unknown    Spouse name: Not on file   Number of children: Not on file   Years of education: Not on file   Highest education level: Not on file  Occupational History   Not on file  Tobacco Use   Smoking status: Never   Smokeless tobacco: Never  Substance and Sexual Activity   Alcohol use: Not on file   Drug use: Not on file   Sexual activity: Not on file  Other Topics Concern   Not on file  Social History Narrative   Not on file   Social Determinants of Health   Financial Resource Strain: Not on file  Food Insecurity: Not on file  Transportation Needs: Not on file  Physical Activity: Not on file  Stress: Not on file  Social Connections: Not on file      Allergies:  No Known Allergies  Metabolic Disorder Labs: Lab  Results  Component Value Date   HGBA1C 4.8 01/25/2021   No results found for: PROLACTIN Lab Results  Component Value Date   CHOL 212 (H) 01/25/2021   TRIG 179 (H) 01/25/2021   HDL 51 01/25/2021   CHOLHDL 4.2 01/25/2021   LDLCALC 129 (H) 01/25/2021   Lab Results  Component Value Date   TSH 2.830 01/25/2021    Therapeutic Level Labs: No results found for: LITHIUM No results found for: CBMZ No results found for: VALPROATE  Current Medications: Current Outpatient Medications  Medication Sig Dispense Refill   melatonin 3 MG  TABS tablet Take 1 tablet (3 mg total) by mouth at bedtime.  0   Multiple Vitamin (MULTI-VITAMIN) tablet      sertraline (ZOLOFT) 100 MG tablet Take half bid 30 tablet 0   traZODone (DESYREL) 50 MG tablet take 1 tablet by oral route  every bedtime as needed for sleep. 30 tablet 0   No current facility-administered medications for this visit.     Psychiatric Specialty Exam: Review of Systems  Cardiovascular:  Negative for chest pain.  Psychiatric/Behavioral:  Negative for agitation and self-injury.    There were no vitals taken for this visit.There is no height or weight on file to calculate BMI.  General Appearance: Casual  Eye Contact:  Fair  Speech:  Normal Rate  Volume:  Decreased  Mood:  Euthymic  Affect:  Constricted  Thought Process:  Goal Directed  Orientation:  Full (Time, Place, and Person)  Thought Content:  Rumination  Suicidal Thoughts:  No  Homicidal Thoughts:  No  Memory:  Immediate;   Fair Recent;   Fair  Judgement:  Fair  Insight:  Fair  Psychomotor Activity:  Normal  Concentration:  Concentration: Fair and Attention Span: Fair  Recall:  [employer] of Knowledge:Good  Language: Good  Akathisia:  No  Handed:    AIMS (if indicated):  not done  Assets:  Desire for Improvement  ADL's:  Intact  Cognition: WNL  Sleep:  Fair   Screenings: PHQ2-9    Flowsheet Row Office Visit from 01/25/2021 in [facility] Video Visit from 01/16/2021 in BE[facility] AT [place]  PHQ-2 Total Score 4 1  PHQ-9 Total Score 18 --      Flowsheet Row Video Visit from 08/28/2021 in [facility] AT [place] Video Visit from 04/23/2021 in [facility] AT [place] Video Visit from 02/19/2021 in BE[facility] AT [place]  C-SSRS RISK CATEGORY No Risk No Risk No Risk       Assessment and Plan: as follows Prior documentation reviewed  MDD recurrent moderate :  somewhat subdued, decrease energy but wants to wait till uses copap machine and see effect Continue 100mg  zoloft , not increase today     Generalized anxiety disorder; manageable, continue zoloft Trazadone at night for sleep Will evaluate after cpap use and its effect as well  Fu 6 - 8w     , MD 10/11/20223:11 PM

## 2021-08-29 ENCOUNTER — Other Ambulatory Visit: Payer: Self-pay

## 2021-09-08 ENCOUNTER — Encounter: Payer: Self-pay | Admitting: Student

## 2021-09-08 DIAGNOSIS — G4739 Other sleep apnea: Secondary | ICD-10-CM

## 2021-09-08 NOTE — Procedures (Signed)
Patient Name: Theodore, [name] Date: 08/27/2021 Gender: Male D.O.B: [birth year] Age (years): 50 Referring Provider: Madison [name] Height (inches): 68 Interpreting Physician: Theodore Lyons MD, ABSM Weight (lbs): 190 RPSGT: Theodore [name] BMI: 29 MRN: [id number] Neck Size: 15.50  CLINICAL INFORMATION The patient is referred for a split night study with BPAP. MEDICATIONS Medications self-administered by patient taken the night of the study :   SLEEP STUDY TECHNIQUE As per the AASM Manual for the Scoring of Sleep and Associated Events v2.3 (April 2016) with a hypopnea requiring 4% desaturations.  The channels recorded and monitored were frontal, central and occipital EEG, electrooculogram (EOG), submentalis EMG (chin), nasal and oral airflow, thoracic and abdominal wall motion, anterior tibialis EMG, snore microphone, electrocardiogram, and pulse oximetry. Bi-level positive airway pressure (BiPAP) was initiated when the patient met split night criteria and was titrated according to treat sleep-disordered breathing.  RESPIRATORY PARAMETERS Diagnostic  Total AHI (/hr): 76.2 RDI (/hr): 95.9 OA Index (/hr): 0.9 CA Index (/hr): 0.0 REM AHI (/hr): 94.7 NREM AHI (/hr): 74.8 Supine AHI (/hr): 95.1 Non-supine AHI (/hr): 84.25 Min O2 Sat (%): 77.0 Mean O2 (%): 92.1 Time below 88% (min): 7   Titration  Optimal IPAP Pressure (cm): 25 Optimal EPAP Pressure (cm): 21 AHI at Optimal Pressure (/hr): 0 Min O2 at Optimal Pressure (%): 93.0 Sleep % at Optimal (%): 18 Supine % at Optimal (%): 0   SLEEP ARCHITECTURE The study was initiated at 10:11:27 PM and terminated at 5:41:04 AM. The total recorded time was 449.6 minutes. EEG confirmed total sleep time was 319 minutes yielding a sleep efficiency of 70.9%%. Sleep onset after lights out was 32.7 minutes with a REM latency of 120.0 minutes. The patient spent 10.0%% of the night in stage N1 sleep, 82.6%% in stage N2 sleep, 0.0%% in stage N3 and  7.4% in REM. Wake after sleep onset (WASO) was 97.9 minutes. The Arousal Index was 59.2/hour.  LEG MOVEMENT DATA The total Periodic Limb Movements of Sleep (PLMS) were 0. The PLMS index was 0.0 .  CARDIAC DATA The 2 lead EKG demonstrated sinus rhythm. The mean heart rate was 100.0 beats per minute. Other EKG findings include: PVCs.  IMPRESSIONS - Severe obstructive sleep apnea occurred during the diagnostic portion of the study (AHI = 76.2 /hour). - CPAP provided inadequate control at tolerated pressure and was changed to bilevel (BIPAP) titration. An optimal BIPAP pressure was selected for this patient ( 25 / 21cm of water) - No significant central sleep apnea occurred during the diagnostic portion of the study (CAI = 0.0/hour). - Oxygen desaturation was noted during the diagnostic portion of the study (Min O2 = 77.0%). MinimumO2 saturation on BIPAP 25/21 was 93%. - The patient snored with soft snoring volume during the diagnostic portion of the study. - EKG findings include PVCs, PACs. - Clinically significant periodic limb movements of sleep did not occur during the study.  DIAGNOSIS - Obstructive Sleep Apnea (G47.33)  RECOMMENDATIONS - Trial of BiPAP therapy on 25/21 cm H2O, PS 3 or autoBIPAP 15-25.  - Patient used a Medium size Resmed Full Face Mask AirFit F20 mask and heated humidification. - Be careful with alcohol, sedatives and other CNS depressants that may worsen sleep apnea and disrupt normal sleep architecture. - Sleep hygiene should be reviewed to assess factors that may improve sleep quality. - Weight management and regular exercise should be initiated or continued.  [Electronically signed] 09/08/2021 10:00 AM  Theodore Lyons MD, ABSM Diplomate, American Board of Sleep Medicine  NPI: [id number]                          [place], American Board of Sleep Medicine  ELECTRONICALLY SIGNED ON:  09/08/2021, 9:53 AM [place] PH: [phone]   FX: [phone] [place]

## 2021-09-10 NOTE — Telephone Encounter (Signed)
Orders for DME BiPAP orders sent. May need prescription printed and patient may pick up. Thanks

## 2021-09-14 NOTE — Telephone Encounter (Signed)
Community message sent to [employer] for processing.  

## 2021-09-17 ENCOUNTER — Encounter: Payer: Self-pay | Admitting: Student

## 2021-09-25 ENCOUNTER — Other Ambulatory Visit (HOSPITAL_COMMUNITY): Payer: Self-pay | Admitting: Psychiatry

## 2021-09-28 ENCOUNTER — Encounter: Payer: Self-pay | Admitting: Student

## 2021-09-28 ENCOUNTER — Other Ambulatory Visit: Payer: Self-pay

## 2021-09-28 ENCOUNTER — Ambulatory Visit (INDEPENDENT_AMBULATORY_CARE_PROVIDER_SITE_OTHER): Payer: 59 | Admitting: Student

## 2021-09-28 VITALS — BP 125/79 | HR 93 | Wt 187.6 lb

## 2021-09-28 DIAGNOSIS — R4189 Other symptoms and signs involving cognitive functions and awareness: Secondary | ICD-10-CM | POA: Diagnosis not present

## 2021-09-28 DIAGNOSIS — L649 Androgenic alopecia, unspecified: Secondary | ICD-10-CM | POA: Diagnosis not present

## 2021-09-28 MED ORDER — FINASTERIDE 1 MG PO TABS: 1.0000 mg | ORAL_TABLET | Freq: Every day | ORAL | 0 refills | Status: DC

## 2021-09-28 NOTE — Patient Instructions (Signed)
It was great meeting you!  I have sent "Propecia" into your pharmacy. Take 1 tablet daily.  Follow up in 3 months, or sooner if needed.  Best wishes, Darral Dash

## 2021-09-28 NOTE — Progress Notes (Signed)
    SUBJECTIVE:   CHIEF COMPLAINT / HPI:   Hair loss Pt reports worsening hair loss since having COVID-19 infection. No itching of the scalp, flakiness, or other symptoms. He is requesting Propecia. Takes OTC hair loss herbal supplements that have biotin, B vitmains and other ingredients. Also uses topical Rogaine and shampoo.  Brain fog Says he has had brain fog since COVID-19 infection, which he attributes his symptoms to. Says he will have periods of "spacing out" and forgetfulness. No headache, weakness, dizziness, or other symptoms. 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  OSA Recently had sleep study and prescription for CPAP nightly. Has yet to receive supplies, said they will come in 7-10 days.  PERTINENT  PMH / PSH: Hyperlipidemia, OSA  OBJECTIVE:   BP 125/79   Pulse 93   Wt 187 lb 9.6 oz (85.1 kg)   SpO2 97%   BMI 28.52 kg/m   General: Well-appearing, pleasant male in no distress Scalp: Diffuse hair loss over anterior and vertex scalp. Skin without erythema, flaking, rash. Respiratory: Normal work of breathing Neuro: Awake, alert and oriented x4. Speech clear and fluent. Answers questions appropriately. No focal neurologic deficits. 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 No concern for scalp infection/fungal infection leading to hair loss- he denies pain, flaking, burning.  - Prescribed Propecia 1mg  daily, will assess efficacy in 3 months  Brain fog OSA is likely contributor as he has yet to start CPAP nightly treatment. No concern for autoimmune condition, hormone imbalance, TIA/CVA, other neurologic disorder. Will continue to monitor as he remains largely asymptomatic otherwise and is functioning normally at his job and school, although he does struggle to  focus at times.     , DO [facility]

## 2021-09-28 NOTE — Assessment & Plan Note (Signed)
OSA is likely contributor as he has yet to start CPAP nightly treatment. No concern for autoimmune condition, hormone imbalance, TIA/CVA, other neurologic disorder. Will continue to monitor as he remains largely asymptomatic otherwise and is functioning normally at his job and school, although he does struggle to focus at times.

## 2021-09-28 NOTE — Assessment & Plan Note (Addendum)
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 No concern for scalp infection/fungal infection leading to hair loss- he denies pain, flaking, burning.  - Prescribed Propecia 1mg  daily, will assess efficacy in 3 months

## 2021-10-02 ENCOUNTER — Encounter: Payer: Self-pay | Admitting: Student

## 2021-10-02 DIAGNOSIS — G4739 Other sleep apnea: Secondary | ICD-10-CM

## 2021-10-09 NOTE — Telephone Encounter (Signed)
Patient left message on voicemail and said that [facility] ([address]) takes his insurance and will cover his cpap.  He is calling back so the order can be sent to them.  Will forward to RN team.  Burnard Hawthorne

## 2021-10-09 NOTE — Telephone Encounter (Signed)
Received phone call from [facility]. Reports that order needs to be updated as they are unable to do trials for Bipap.   Please advise.   Veronda Prude, RN

## 2021-10-24 ENCOUNTER — Telehealth: Payer: Self-pay

## 2021-10-24 NOTE — Telephone Encounter (Signed)
Patient calls nurse line reporting he still has not received his Bipap Supplies.   Fax pulled from ~11/22 and refaxed today.   The patient has been updated.

## 2021-10-29 ENCOUNTER — Encounter (HOSPITAL_COMMUNITY): Payer: Self-pay | Admitting: Psychiatry

## 2021-10-29 ENCOUNTER — Telehealth (INDEPENDENT_AMBULATORY_CARE_PROVIDER_SITE_OTHER): Payer: 59 | Admitting: Psychiatry

## 2021-10-29 DIAGNOSIS — F1090 Alcohol use, unspecified, uncomplicated: Secondary | ICD-10-CM

## 2021-10-29 DIAGNOSIS — Z789 Other specified health status: Secondary | ICD-10-CM | POA: Diagnosis not present

## 2021-10-29 DIAGNOSIS — F331 Major depressive disorder, recurrent, moderate: Secondary | ICD-10-CM

## 2021-10-29 DIAGNOSIS — F411 Generalized anxiety disorder: Secondary | ICD-10-CM

## 2021-10-29 MED ORDER — SERTRALINE HCL 100 MG PO TABS: ORAL_TABLET | ORAL | 1 refills | Status: DC

## 2021-10-29 NOTE — Progress Notes (Signed)
[facility] Follow up visit  Patient Identification: Theodore [name] MRN:  [id number] Date of Evaluation:  10/29/2021 Referral Source: [facility] Chief Complaint: follow up depression Visit Diagnosis:    ICD-10-CM   1. MDD (major depressive disorder), recurrent episode, moderate (HCC)  F33.1     2. GAD (generalized anxiety disorder)  F41.1     3. Alcohol use  Z78.9      Virtual Visit via Telephone Note  I connected with Theodore [name] on 10/29/21 at  3:30 PM EST by telephone and verified that I am speaking with the correct person using two identifiers.  Location: Patient: home Provider: home office   I discussed the limitations, risks, security and privacy concerns of performing an evaluation and management service by telephone and the availability of in person appointments. I also discussed with the patient that there may be a patient responsible charge related to this service. The patient expressed understanding and agreed to proceed.     I discussed the assessment and treatment plan with the patient. The patient was provided an opportunity to ask questions and all were answered. The patient agreed with the plan and demonstrated an understanding of the instructions.   The patient was advised to call back or seek an in-person evaluation if the symptoms worsen or if the condition fails to improve as anticipated.  I provided 15 minutes of non-face-to-face time during this encounter.     History of Present Illness: Patient is a 50  years old currently single Caucasian male initially referred by last psychiatrist or [facility] Dr Nila Nephew as he has private insurance now and could not be seen at [facility].  He has been diagnosed with depression and anxiety has been on sertraline at a dose of 150 mg.    Keeps busy with music, helps dad who is sick and goes [facility] at time overwhemed Got sleep study done has severe sleep apnea and looking for ward to mask, expecing it will help fatigue and  sleep Encouraged compliance  Says has cut down alcohol and not using it any more    Aggravating factor: difficult childood, lost BF when young , moms death 5 years ago Modifying factor: Dad, music, drums   Past Psychiatric History: depression, anxiety  Previous Psychotropic Medications: No   Substance Abuse History in the last 12 months:  Yes.    Consequences of Substance Abuse: have cut down alcohol, understands its effect on mood, memory, depression  Past Medical History: History reviewed. No pertinent past medical history. History reviewed. No pertinent surgical history.  Family Psychiatric History: mom : depression, dementia  Family History: History reviewed. No pertinent family history.  Social History:   Social History   Socioeconomic History   Marital status: Unknown    Spouse name: Not on file   Number of children: Not on file   Years of education: Not on file   Highest education level: Not on file  Occupational History   Not on file  Tobacco Use   Smoking status: Never   Smokeless tobacco: Never  Substance and Sexual Activity   Alcohol use: Not on file   Drug use: Not on file   Sexual activity: Not on file  Other Topics Concern   Not on file  Social History Narrative   Not on file   Social Determinants of Health   Financial Resource Strain: Not on file  Food Insecurity: Not on file  Transportation Needs: Not on file  Physical Activity: Not on file  Stress:  Not on file  Social Connections: Not on file      Allergies:  No Known Allergies  Metabolic Disorder Labs: Lab Results  Component Value Date   HGBA1C 4.8 01/25/2021   No results found for: PROLACTIN Lab Results  Component Value Date   CHOL 212 (H) 01/25/2021   TRIG 179 (H) 01/25/2021   HDL 51 01/25/2021   CHOLHDL 4.2 01/25/2021   LDLCALC 129 (H) 01/25/2021   Lab Results  Component Value Date   TSH 2.830 01/25/2021    Therapeutic Level Labs: No results found for: LITHIUM No  results found for: CBMZ No results found for: VALPROATE  Current Medications: Current Outpatient Medications  Medication Sig Dispense Refill   finasteride (PROPECIA) 1 MG tablet Take 1 tablet (1 mg total) by mouth daily. 90 tablet 0   melatonin 3 MG TABS tablet Take 1 tablet (3 mg total) by mouth at bedtime.  0   Multiple Vitamin (MULTI-VITAMIN) tablet      sertraline (ZOLOFT) 100 MG tablet TAKE 1/2 TABLET BY MOUTH TWICE DAILY 30 tablet 1   traZODone (DESYREL) 50 MG tablet take 1 tablet by oral route  every bedtime as needed for sleep. 30 tablet 0   No current facility-administered medications for this visit.     Psychiatric Specialty Exam: Review of Systems  Cardiovascular:  Negative for chest pain.  Psychiatric/Behavioral:  Negative for agitation and self-injury.    There were no vitals taken for this visit.There is no height or weight on file to calculate BMI.  General Appearance:  Eye Contact:    Speech:  Normal Rate  Volume:  Decreased  Mood:  Euthymic  Affect:    Thought Process:  Goal Directed  Orientation:  Full (Time, Place, and Person)  Thought Content:  Rumination  Suicidal Thoughts:  No  Homicidal Thoughts:  No  Memory:  Immediate;   Fair Recent;   Fair  Judgement:  Fair  Insight:  Fair  Psychomotor Activity:  Normal  Concentration:  Concentration: Fair and Attention Span: Fair  Recall:  [employer] of Knowledge:Good  Language: Good  Akathisia:  No  Handed:    AIMS (if indicated):  not done  Assets:  Desire for Improvement  ADL's:  Intact  Cognition: WNL  Sleep:  Fair   Screenings: PHQ2-9    Flowsheet Row Office Visit from 09/28/2021 in [facility] Office Visit from 01/25/2021 in [facility] Video Visit from 01/16/2021 in [facility] AT [place]  PHQ-2 Total Score 1 4 1   PHQ-9 Total Score 10 18 --      Flowsheet Row Video Visit from 10/29/2021 in [facility] AT [place] Video Visit from 08/28/2021 in [facility] AT [place] Video Visit from 04/23/2021 in [facility] AT [place]  C-SSRS RISK CATEGORY No Risk No Risk No Risk       Assessment and Plan: as follows  Prior documentation reviewed   MDD recurrent moderate :somewhat subdued, low energy but understands now it may be sleep apnea, will get cpap, continue meds      Generalized anxiety disorder; manageable with meds, dont want to increase it , provided supportive therapy. Continue zoloft 100mg  qd  Fu 52m    , MD 12/12/20224:41 PM

## 2021-11-01 ENCOUNTER — Other Ambulatory Visit (HOSPITAL_COMMUNITY): Payer: Self-pay | Admitting: Psychiatry

## 2021-11-02 ENCOUNTER — Encounter: Payer: Self-pay | Admitting: Student

## 2021-11-05 ENCOUNTER — Encounter (HOSPITAL_COMMUNITY): Payer: Self-pay

## 2021-11-05 ENCOUNTER — Ambulatory Visit
Admission: EM | Admit: 2021-11-05 | Discharge: 2021-11-05 | Disposition: A | Discharge status: Home / Self Care | Payer: 59

## 2021-11-05 ENCOUNTER — Emergency Department (HOSPITAL_COMMUNITY)
Admission: EM | Admit: 2021-11-05 | Discharge: 2021-11-05 | Disposition: A | Discharge status: Home / Self Care | Payer: 59 | Attending: Emergency Medicine | Admitting: Emergency Medicine

## 2021-11-05 ENCOUNTER — Emergency Department (HOSPITAL_COMMUNITY): Payer: 59

## 2021-11-05 ENCOUNTER — Other Ambulatory Visit: Payer: Self-pay

## 2021-11-05 DIAGNOSIS — R209 Unspecified disturbances of skin sensation: Secondary | ICD-10-CM | POA: Insufficient documentation

## 2021-11-05 DIAGNOSIS — E119 Type 2 diabetes mellitus without complications: Secondary | ICD-10-CM | POA: Diagnosis not present

## 2021-11-05 DIAGNOSIS — R262 Difficulty in walking, not elsewhere classified: Secondary | ICD-10-CM | POA: Diagnosis present

## 2021-11-05 DIAGNOSIS — Z20822 Contact with and (suspected) exposure to covid-19: Secondary | ICD-10-CM | POA: Insufficient documentation

## 2021-11-05 DIAGNOSIS — M542 Cervicalgia: Secondary | ICD-10-CM | POA: Diagnosis not present

## 2021-11-05 DIAGNOSIS — M546 Pain in thoracic spine: Secondary | ICD-10-CM | POA: Diagnosis not present

## 2021-11-05 DIAGNOSIS — M79622 Pain in left upper arm: Secondary | ICD-10-CM | POA: Insufficient documentation

## 2021-11-05 DIAGNOSIS — Z751 Person awaiting admission to adequate facility elsewhere: Secondary | ICD-10-CM

## 2021-11-05 DIAGNOSIS — M62830 Muscle spasm of back: Secondary | ICD-10-CM | POA: Diagnosis not present

## 2021-11-05 DIAGNOSIS — R200 Anesthesia of skin: Secondary | ICD-10-CM

## 2021-11-05 LAB — DIFFERENTIAL
Abs Immature Granulocytes: 0.03 10*3/uL (ref 0.00–0.07)
Basophils Absolute: 0 10*3/uL (ref 0.0–0.1)
Basophils Relative: 0 %
Eosinophils Absolute: 0.1 10*3/uL (ref 0.0–0.5)
Eosinophils Relative: 1 %
Immature Granulocytes: 0 %
Lymphocytes Relative: 25 %
Lymphs Abs: 1.7 10*3/uL (ref 0.7–4.0)
Monocytes Absolute: 0.5 10*3/uL (ref 0.1–1.0)
Monocytes Relative: 7 %
Neutro Abs: 4.6 10*3/uL (ref 1.7–7.7)
Neutrophils Relative %: 67 %

## 2021-11-05 LAB — CBC
HCT: 44.2 % (ref 39.0–52.0)
Hemoglobin: 14.7 g/dL (ref 13.0–17.0)
MCH: 32.5 pg (ref 26.0–34.0)
MCHC: 33.3 g/dL (ref 30.0–36.0)
MCV: 97.6 fL (ref 80.0–100.0)
Platelets: 216 10*3/uL (ref 150–400)
RBC: 4.53 MIL/uL (ref 4.22–5.81)
RDW: 11.5 % (ref 11.5–15.5)
WBC: 6.9 10*3/uL (ref 4.0–10.5)
nRBC: 0 % (ref 0.0–0.2)

## 2021-11-05 LAB — PROTIME-INR
INR: 1 (ref 0.8–1.2)
Prothrombin Time: 12.9 seconds (ref 11.4–15.2)

## 2021-11-05 LAB — APTT: aPTT: 30 seconds (ref 24–36)

## 2021-11-05 LAB — COMPREHENSIVE METABOLIC PANEL
ALT: 17 U/L (ref 0–44)
AST: 18 U/L (ref 15–41)
Albumin: 4 g/dL (ref 3.5–5.0)
Alkaline Phosphatase: 52 U/L (ref 38–126)
Anion gap: 6 (ref 5–15)
BUN: 18 mg/dL (ref 6–20)
CO2: 28 mmol/L (ref 22–32)
Calcium: 9.4 mg/dL (ref 8.9–10.3)
Chloride: 103 mmol/L (ref 98–111)
Creatinine, Ser: 1.28 mg/dL — ABNORMAL HIGH (ref 0.61–1.24)
GFR, Estimated: >60 mL/min (ref >60)
Glucose, Bld: 97 mg/dL (ref 70–99)
Potassium: 4.2 mmol/L (ref 3.5–5.1)
Sodium: 137 mmol/L (ref 135–145)
Total Bilirubin: 0.6 mg/dL (ref 0.3–1.2)
Total Protein: 6.7 g/dL (ref 6.5–8.1)

## 2021-11-05 LAB — URINALYSIS, ROUTINE W REFLEX MICROSCOPIC
Bilirubin Urine: NEGATIVE
Glucose, UA: NEGATIVE mg/dL
Hgb urine dipstick: NEGATIVE
Ketones, ur: NEGATIVE mg/dL
Leukocytes,Ua: NEGATIVE
Nitrite: NEGATIVE
Protein, ur: NEGATIVE mg/dL
Specific Gravity, Urine: >1.03 — ABNORMAL HIGH (ref 1.005–1.030)
pH: 6 (ref 5.0–8.0)

## 2021-11-05 LAB — RAPID URINE DRUG SCREEN, HOSP PERFORMED
Amphetamines: NOT DETECTED
Barbiturates: NOT DETECTED
Benzodiazepines: NOT DETECTED
Cocaine: NOT DETECTED
Opiates: NOT DETECTED
Tetrahydrocannabinol: NOT DETECTED

## 2021-11-05 LAB — RESP PANEL BY RT-PCR (FLU A&B, COVID) ARPGX2
Influenza A by PCR: NEGATIVE
Influenza B by PCR: NEGATIVE
SARS Coronavirus 2 by RT PCR: NEGATIVE

## 2021-11-05 LAB — ETHANOL: Alcohol, Ethyl (B): <10 mg/dL (ref <10)

## 2021-11-05 MED ORDER — KETOROLAC TROMETHAMINE 60 MG/2ML IM SOLN
30.0000 mg | Freq: Once | INTRAMUSCULAR | Status: AC
  Administered 2021-11-05: 19:00:00 30 mg via INTRAMUSCULAR
  Filled 2021-11-05: qty 2

## 2021-11-05 MED ORDER — METHOCARBAMOL 500 MG PO TABS: 500.0000 mg | ORAL_TABLET | Freq: Two times a day (BID) | ORAL | 0 refills | Status: DC

## 2021-11-05 NOTE — ED Triage Notes (Signed)
Pt c/o left shoulder arm hand numbness tingling and pain onset x3 days ago states he first noticed it when he woke up out of bed denies remembering any injury to the area.

## 2021-11-05 NOTE — ED Provider Notes (Signed)
Here today for evaluation of left arm numbness and tingling that started about 3 days ago but it seemed to worsen today.  Patient states this is not a typical symptom for him.  He has had some shortness of breath and chills but is not sure if this is related.  He does not report any chest pain.  Given symptoms somewhat concerning for stroke, recommended further evaluation in the emergency department.  He is outside of code stroke window and does have a friend who can transport him safely to the ED.   Tomi Bamberger, PA-C 11/05/21 1425

## 2021-11-05 NOTE — Discharge Instructions (Signed)
You may use over-the-counter Motrin (Ibuprofen), Acetaminophen (Tylenol), topical muscle creams such as SalonPas, Icy Hot, Bengay, etc. Please stretch, apply ice or heat (whichever helps), and have massage therapy for additional assistance.  

## 2021-11-05 NOTE — ED Provider Notes (Signed)
Emergency Medicine Provider Triage Evaluation Note  Theodore [name] , a 50 y.o. male  was evaluated in triage.  Pt complains of left arm and leg numbness and weakness now involving face.  Patient states that he has had been having these symptoms intermittently for the past several days.  He was last normal on Friday.  He states that the symptoms have progressively worsened.  He woke up with the symptoms today.  He denies chest pain, shortness of breath.  He feels weak and having difficulty walking, no vision changes..  Review of Systems  Positive: Is a numbness Negative: Fever  Physical Exam  BP 134/81 (BP Location: Right Arm)    Pulse 72    Temp (!) 97.5 F (36.4 C) (Oral)    Resp 18    SpO2 95%  Gen:   Awake, no distress   Resp:  Normal effort  MSK:   Moves extremities without difficulty  Other:  Weakness on the left.  Seems effort based.  Medical Decision Making  Medically screening exam initiated at 3:54 PM.  Appropriate orders placed.  [name] was informed that the remainder of the evaluation will be completed by another provider, this initial triage assessment does not replace that evaluation, and the importance of remaining in the ED until their evaluation is complete.  Stroke work-up initiated.  No evidence of LVO, no evidence for code stroke.   Arthor Captain, PA-C 11/05/21 1557    Ernie Avena, MD 11/05/21 [phone]

## 2021-11-05 NOTE — ED Triage Notes (Signed)
Pt here for intermittent L arm, leg and face numbness and weakness. Pt states he's had it on and off but he woke up this morning and it was much worse. Pt denies cp, shob. Pt reports he last felt normal on Friday.

## 2021-11-05 NOTE — ED Provider Notes (Signed)
[facility] EMERGENCY DEPARTMENT Provider Note  CSN: [id number] Arrival date & time: 11/05/21 1505  Chief Complaint(s) Numbness and Neurologic Problem  HPI Theodore [name] is a 50 y.o. male here for left upper extremity pain (described as stiffness) that was initially intermittent over the past 2 days but constant since this morning now associated with numbness and sensation of the limb falling asleep.  Patient also endorses having difficulty using the left upper extremity.  This is now also involving the left lower extremity.  Reports having difficulty ambulating due to pain associated with movement.  He also reports having the numbness/tingling to the left side of his face.  He denies any falls or trauma..  No visual disturbance.  No difficulty swallowing or speaking. No bladder/bowel incontinence.  Patient denies history of hypertension, hyperlipidemia, diabetes.   Neurologic Problem   Past Medical History History reviewed. No pertinent past medical history. 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 Authorizing Provider  finasteride (PROPECIA) 1 MG tablet Take 1 tablet (1 mg total) by mouth daily. 09/28/21  Yes Dameron, Nolberto Hanlon, DO  methocarbamol (ROBAXIN) 500 MG tablet Take 1 tablet (500 mg total) by mouth 2 (two) times daily. 11/05/21  Yes Zain Lankford, Amadeo Garnet, MD  Multiple Vitamin (MULTI-VITAMIN) tablet    Yes [provider]  sertraline (ZOLOFT) 100 MG tablet TAKE 1/2 TABLET BY MOUTH TWICE DAILY Patient taking differently: Take 100 mg by mouth daily. TAKE 1/2 TABLET BY MOUTH TWICE DAILY 10/29/21  Yes Thresa Ross, MD  traZODone (DESYREL) 50  MG tablet TAKE 1 TABLET BY MOUTH EVERY NIGHT AT BEDTIME AS NEEDED FOR SLEEP Patient taking differently: Take 50 mg by mouth at bedtime as needed. TAKE 1 TABLET BY MOUTH EVERY NIGHT AT BEDTIME AS NEEDED FOR SLEEP 11/01/21  Yes Thresa Ross, MD  melatonin 3 MG TABS tablet Take 1 tablet (3 mg total) by mouth at bedtime. Patient not taking: Reported on 11/05/2021 06/04/21   Jovita Kussmaul, MD                                                                                                                                    Past Surgical History History reviewed. No pertinent surgical history. Family History History reviewed. No pertinent family history.  Social History Social History   Tobacco Use   Smoking status: Never   Smokeless tobacco: Never   Allergies Patient has no known allergies.  Review of Systems Review of Systems All other systems are reviewed and are negative for acute change except as noted in the HPI  Physical Exam Vital Signs  I have reviewed  the triage vital signs BP 134/81 (BP Location: Right Arm)    Pulse 72    Temp (!) 97.5 F (36.4 C) (Oral)    Resp 18    SpO2 95%   Physical Exam Vitals reviewed.  Constitutional:      General: He is not in acute distress.    Appearance: He is well-developed. He is not diaphoretic.  HENT:     Head: Normocephalic and atraumatic.     Nose: Nose normal.  Eyes:     General: No scleral icterus.       Right eye: No discharge.        Left eye: No discharge.     Conjunctiva/sclera: Conjunctivae normal.     Pupils: Pupils are equal, round, and reactive to light.  Cardiovascular:     Rate and Rhythm: Normal rate and regular rhythm.     Heart sounds: No murmur heard.   No friction rub. No gallop.  Pulmonary:     Effort: Pulmonary effort is normal. No respiratory distress.     Breath sounds: Normal breath sounds. No stridor. No rales.  Abdominal:     General: There is no distension.     Palpations: Abdomen is soft.      Tenderness: There is no abdominal tenderness.  Musculoskeletal:     Cervical back: Normal range of motion and neck supple. Spasms and tenderness present. No bony tenderness.     Thoracic back: Spasms and tenderness present. No bony tenderness.     Lumbar back: Tenderness present. No bony tenderness.  Skin:    General: Skin is warm and dry.     Findings: No erythema or rash.  Neurological:     Mental Status: He is alert and oriented to person, place, and time.     Comments: Mental Status:  Alert and oriented to person, place, and time.  Attention and concentration normal.  Speech clear.  Recent memory is intact  Cranial Nerves:  II Visual Fields: Intact to confrontation. Visual fields intact. III, IV, VI: Pupils equal and reactive to light and near. Full eye movement without nystagmus  V Facial Sensation: Normal. No weakness of masticatory muscles  VII: No facial weakness or asymmetry  VIII Auditory Acuity: Grossly normal  IX/X: The uvula is midline; the palate elevates symmetrically  XI: Normal sternocleidomastoid and trapezius strength  XII: The tongue is midline. No atrophy or fasciculations.   Motor System: Muscle Strength: Initially limited due to pain, but when compensating and readjusting for pain, he has 5/5 and symmetric in the upper and lower extremities. No pronation or drift.  Muscle Tone: Tone and muscle bulk are normal in the upper and lower extremities.  Reflexes: DTRs: 1+ and symmetrical in all four extremities. No Clonus Coordination: Intact finger-to-nose, heel-to-shin. No tremor.  Sensation: Intact to light touch.  Gait: Routine gait normal.     ED Results and Treatments Labs (all labs ordered are listed, but only abnormal results are displayed) Labs Reviewed  COMPREHENSIVE METABOLIC PANEL - Abnormal; Notable for the following components:      Result Value   Creatinine, Ser 1.28 (*)    All other components within normal limits  URINALYSIS, ROUTINE W REFLEX  MICROSCOPIC - Abnormal; Notable for the following components:   Specific Gravity, Urine >1.030 (*)    All other components within normal limits  RESP PANEL BY RT-PCR (FLU A&B, COVID) ARPGX2  ETHANOL  PROTIME-INR  APTT  CBC  DIFFERENTIAL  RAPID URINE DRUG SCREEN, HOSP  PERFORMED                                                                                                                         EKG  EKG Interpretation  Date/Time:  Monday November 05 2021 15:43:45 EST Ventricular Rate:  77 PR Interval:  168 QRS Duration: 94 QT Interval:  368 QTC Calculation: 416 R Axis:   60 Text Interpretation: Normal sinus rhythm Baseline wander with artifact No acute changes Confirmed by Addison Lank [phone]) on 11/05/2021 5:53:07 PM       Radiology CT HEAD WO CONTRAST  Result Date: 11/05/2021 CLINICAL DATA:  Left sided weakness EXAM: CT HEAD WITHOUT CONTRAST TECHNIQUE: Contiguous axial images were obtained from the base of the skull through the vertex without intravenous contrast. COMPARISON:  None. FINDINGS: Brain: No evidence of acute infarction, hemorrhage, hydrocephalus, extra-axial collection or mass lesion/mass effect. Vascular: No hyperdense vessel or unexpected calcification. Skull: Normal. Negative for fracture or focal lesion. Sinuses/Orbits: No acute finding. Other: None IMPRESSION: Negative Electronically Signed   By: Lucrezia Europe M.D.   On: 11/05/2021 17:15    Pertinent labs & imaging results that were available during my care of the patient were reviewed by me and considered in my medical decision making (see MDM for details).  Medications Ordered in ED Medications  ketorolac (TORADOL) injection 30 mg (30 mg Intramuscular Given 11/05/21 1841)                                                                                                                                     Procedures Procedures  (including critical care time)  Medical Decision Making / ED Course I have  reviewed the nursing notes for this encounter and the patient's prior records (if available in EHR or on provided paperwork).  Eulis Ahlquist was evaluated in Emergency Department on 11/05/2021 for the symptoms described in the history of present illness. He was evaluated in the context of the global COVID-19 pandemic, which necessitated consideration that the patient might be at risk for infection with the SARS-CoV-2 virus that causes COVID-19. Institutional protocols and algorithms that pertain to the evaluation of patients at risk for COVID-19 are in a state of rapid change based on information released by regulatory bodies including the CDC and federal and state organizations. These policies and algorithms were followed during the patient's care in the ED.  Left sided paresthesia Associated left back muscle strain/spasms. Symptoms improved with manipulation and even more so after toradol. Had stroke work up initiated during [facility] which was reassuring with CT head negative for ICH, mass-effect, or old stroke.  Labs grossly reassuring without leukocytosis or anemia.  No significant electrolyte derangements or significant renal insufficiency. Presentation is most consistent with muscle strain/spasm and likely impinged nerves. Low suspicion for CVA/TIA Continue supportive management recommended.  Pertinent labs & imaging results that were available during my care of the patient were reviewed by me and considered in my medical decision making:    Final Clinical Impression(s) / ED Diagnoses Final diagnoses:  Muscle spasm of back    The patient appears reasonably screened and/or stabilized for discharge and I doubt any other medical condition or other [facility] requiring further screening, evaluation, or treatment in the ED at this time prior to discharge. Safe for discharge with strict return precautions.  Disposition: Discharge  Condition: Good  I have discussed the results, Dx and Tx plan with  the patient/family who expressed understanding and agree(s) with the plan. Discharge instructions discussed at length. The patient/family was given strict return precautions who verbalized understanding of the instructions. No further questions at time of discharge.    ED Discharge Orders          Ordered    methocarbamol (ROBAXIN) 500 MG tablet  2 times daily        11/05/21 1953              Follow Up: Primary care provider  Call  as needed    This chart was dictated using voice recognition software.  Despite best efforts to proofread,  errors can occur which can change the documentation meaning.    Fatima Blank, MD 11/05/21 1958

## 2021-11-26 ENCOUNTER — Other Ambulatory Visit (HOSPITAL_COMMUNITY): Payer: Self-pay | Admitting: Psychiatry

## 2021-12-05 ENCOUNTER — Telehealth: Payer: Self-pay | Admitting: Student

## 2021-12-05 NOTE — Telephone Encounter (Signed)
CPAP order form dropped off to be completed. Last DOS was 09/28/21. Placed in [employer].

## 2021-12-06 NOTE — Telephone Encounter (Signed)
Form placed in PCP box for completion. Nothing for clinic staff to complete. Namon Villarin Zimmerman Rumple, CMA

## 2021-12-18 ENCOUNTER — Other Ambulatory Visit: Payer: Self-pay | Admitting: Student

## 2022-01-28 ENCOUNTER — Telehealth (HOSPITAL_COMMUNITY): Payer: 59 | Admitting: Psychiatry

## 2022-02-04 ENCOUNTER — Telehealth (HOSPITAL_COMMUNITY): Payer: 59 | Admitting: Psychiatry

## 2022-02-04 ENCOUNTER — Encounter (HOSPITAL_COMMUNITY): Payer: Self-pay

## 2022-02-08 ENCOUNTER — Telehealth (HOSPITAL_COMMUNITY): Payer: Self-pay

## 2022-02-11 ENCOUNTER — Other Ambulatory Visit: Payer: Self-pay

## 2022-02-11 ENCOUNTER — Telehealth (INDEPENDENT_AMBULATORY_CARE_PROVIDER_SITE_OTHER): Payer: 59 | Admitting: Psychiatry

## 2022-02-11 ENCOUNTER — Encounter (HOSPITAL_COMMUNITY): Payer: Self-pay | Admitting: Psychiatry

## 2022-02-11 ENCOUNTER — Telehealth: Payer: Self-pay | Admitting: Internal Medicine

## 2022-02-11 DIAGNOSIS — F411 Generalized anxiety disorder: Secondary | ICD-10-CM | POA: Diagnosis not present

## 2022-02-11 DIAGNOSIS — F331 Major depressive disorder, recurrent, moderate: Secondary | ICD-10-CM | POA: Diagnosis not present

## 2022-02-11 DIAGNOSIS — Z789 Other specified health status: Secondary | ICD-10-CM | POA: Diagnosis not present

## 2022-02-11 DIAGNOSIS — G4733 Obstructive sleep apnea (adult) (pediatric): Secondary | ICD-10-CM

## 2022-02-11 MED ORDER — SERTRALINE HCL 100 MG PO TABS: 50.0000 mg | ORAL_TABLET | Freq: Two times a day (BID) | ORAL | 1 refills | Status: DC

## 2022-02-11 NOTE — Progress Notes (Signed)
[facility] Follow up visit ? ?Patient Identification: Theodore [name] ?MRN:  [id number] ?Date of Evaluation:  02/11/2022 ?Referral Source: [facility] ?Chief Complaint: follow up depression ?Visit Diagnosis:  ?  ICD-10-CM   ?1. MDD (major depressive disorder), recurrent episode, moderate (HCC)  F33.1   ?  ?2. GAD (generalized anxiety disorder)  F41.1   ?  ?3. Alcohol use  Z78.9   ?  ? ?Virtual Visit via Video Note ? ?I connected with [name] on 02/11/22 at  8:30 AM EDT by a video enabled telemedicine application and verified that I am speaking with the correct person using two identifiers. ? ?Location: ?Patient: home ?Provider: home office ?  ?I discussed the limitations of evaluation and management by telemedicine and the availability of in person appointments. The patient expressed understanding and agreed to proceed. ? ? ?  ?I discussed the assessment and treatment plan with the patient. The patient was provided an opportunity to ask questions and all were answered. The patient agreed with the plan and demonstrated an understanding of the instructions. ?  ?The patient was advised to call back or seek an in-person evaluation if the symptoms worsen or if the condition fails to improve as anticipated. ? ?I provided 20 minutes of non-face-to-face time during this encounter. ? ? ? ? ? ? ?History of Present Illness: Patient is a 51 years old currently single Caucasian male initially referred by last psychiatrist or [facility] Dr Nila Nephew as he has private insurance now and could not be [facility].  He has been diagnosed with depression and anxiety has been on sertraline at a dose of 150 mg.  ? ? ?Keeps busy with music, dad got sick, he helps him our ?Have stopped alcohol 3 months ago ?Going [employer] but at times have to miss class for helping dad ?Changing to BIPap still foggy in the am ? ?Depression maangeb;e ?Drinking more water since not alcohol ? ? ?Aggravating factor: difficult childood, lost BF when young , moms death 5  years ago, dad sick ?Modifying factor: Dad, music, drums ?Severity manageable ? ?Past Psychiatric History: depression, anxiety ? ?Previous Psychotropic Medications: No  ? ?Substance Abuse History in the last 12 months:  Yes.   ? ?Consequences of Substance Abuse: ?have cut down alcohol, understands its effect on mood, memory, depression ? ?Past Medical History: History reviewed. No pertinent past medical history. History reviewed. No pertinent surgical history. ? ?Family Psychiatric History: mom : depression, dementia ? ?Family History: History reviewed. No pertinent family history. ? ?Social History:   ?Social History  ? ?Socioeconomic History  ? Marital status: Unknown  ?  Spouse name: Not on file  ? Number of children: Not on file  ? Years of education: Not on file  ? Highest education level: Not on file  ?Occupational History  ? Not on file  ?Tobacco Use  ? Smoking status: Never  ? Smokeless tobacco: Never  ?Substance and Sexual Activity  ? Alcohol use: Not on file  ? Drug use: Not on file  ? Sexual activity: Not on file  ?Other Topics Concern  ? Not on file  ?Social History Narrative  ? Not on file  ? ?Social Determinants of Health  ? ?Financial Resource Strain: Not on file  ?Food Insecurity: Not on file  ?Transportation Needs: Not on file  ?Physical Activity: Not on file  ?Stress: Not on file  ?Social Connections: Not on file  ? ? ? ? ?Allergies:  No Known Allergies ? ?Metabolic Disorder Labs: ?  Lab Results  ?Component Value Date  ? HGBA1C 4.8 01/25/2021  ? ?No results found for: PROLACTIN ?Lab Results  ?Component Value Date  ? CHOL 212 (H) 01/25/2021  ? TRIG 179 (H) 01/25/2021  ? HDL 51 01/25/2021  ? CHOLHDL 4.2 01/25/2021  ? LDLCALC 129 (H) 01/25/2021  ? ?Lab Results  ?Component Value Date  ? TSH 2.830 01/25/2021  ? ? ?Therapeutic Level Labs: ?No results found for: LITHIUM ?No results found for: CBMZ ?No results found for: VALPROATE ? ?Current Medications: ?Current Outpatient Medications  ?Medication Sig  Dispense Refill  ? finasteride (PROPECIA) 1 MG tablet TAKE 1 TABLET(1 MG) BY MOUTH DAILY 90 tablet 0  ? melatonin 3 MG TABS tablet Take 1 tablet (3 mg total) by mouth at bedtime. (Patient not taking: Reported on 11/05/2021)  0  ? methocarbamol (ROBAXIN) 500 MG tablet Take 1 tablet (500 mg total) by mouth 2 (two) times daily. 20 tablet 0  ? Multiple Vitamin (MULTI-VITAMIN) tablet     ? sertraline (ZOLOFT) 100 MG tablet Take 0.5 tablets (50 mg total) by mouth 2 (two) times daily. 30 tablet 1  ? ?No current facility-administered medications for this visit.  ? ? ? ?Psychiatric Specialty Exam: ?Review of Systems  ?Cardiovascular:  Negative for chest pain.  ?Psychiatric/Behavioral:  Negative for agitation and self-injury.    ?There were no vitals taken for this visit.There is no height or weight on file to calculate BMI.  ?General Appearance:casual  ?Eye Contact:  fair  ?Speech:  Normal Rate  ?Volume:  Decreased  ?Mood:  Euthymic  ?Affect:  fair  ?Thought Process:  Goal Directed  ?Orientation:  Full (Time, Place, and Person)  ?Thought Content:  Rumination  ?Suicidal Thoughts:  No  ?Homicidal Thoughts:  No  ?Memory:  Immediate;   Fair ?Recent;   Fair  ?Judgement:  Fair  ?Insight:  Fair  ?Psychomotor Activity:  Normal  ?Concentration:  Concentration: Fair and Attention Span: Fair  ?Recall:  Fair  ?Fund of Knowledge:Good  ?Language: Good  ?Akathisia:  No  ?Handed:    ?AIMS (if indicated):  not done  ?Assets:  Desire for Improvement  ?ADL's:  Intact  ?Cognition: WNL  ?Sleep:  Fair  ? ?Screenings: ?PHQ2-9   ? ?Flowsheet Row Office Visit from 09/28/2021 in [facility] Office Visit from 01/25/2021 in [facility] Video Visit from 01/16/2021 in [facility] AT [place]  ?PHQ-2 Total Score 1 4 1   ?PHQ-9 Total Score 10 18 --  ? ?  ? ?Flowsheet Row Video Visit from 02/11/2022 in [facility] AT [place] ?Most recent reading at 02/11/2022   8:36 AM ED from 11/05/2021 in [facility] EMERGENCY DEPARTMENT ?Most recent reading at 11/05/2021  3:53 PM ED from 11/05/2021 in [facility] at [facility]  ?Most recent reading at 11/05/2021  2:08 PM  ?C-SSRS RISK CATEGORY No Risk No Risk No Risk  ? ?  ? ? ?Assessment and Plan: as follows ? ?Prior documentation reviewed ? ? ?MDD recurrent moderate : doing fair, continue zoloft ?  ? ? ? ?Generalized anxiety disorder; manageable, worries related to dad overall handling it ?Alcohol use: have stopped, denies craving, feels more alert except at morning ?Discussed to sleep one hour earlier and use bipap consistent ? ?Fu 28m renewed meds ? ? ? ?1m, MD ?3/27/20238:49 AM ?

## 2022-02-11 NOTE — Telephone Encounter (Signed)
Received fax from [facility] stating that patient cannot tolerate the BIPAPs pressure settings. Dr. Maple Hudson would like settings changed to 20/16 PS 2. Order has been placed. Nothing further needed at this time.  ?

## 2022-02-12 ENCOUNTER — Encounter (HOSPITAL_COMMUNITY): Payer: Self-pay | Admitting: Psychiatry

## 2022-02-12 ENCOUNTER — Telehealth (HOSPITAL_COMMUNITY): Payer: Self-pay

## 2022-02-12 MED ORDER — HYDROXYZINE PAMOATE 25 MG PO CAPS: 25.0000 mg | ORAL_CAPSULE | Freq: Every day | ORAL | 0 refills | Status: DC | PRN

## 2022-02-12 NOTE — Telephone Encounter (Signed)
Patient says he had an appt with you yesterday but has been having severe panic attacks all day off and on. He says the only thing helping him at this point is staying in bed. He says that the panic attacks are affecting his ability to go to work and school. He wants to know what he can do about the panic attacks and also wants a letter for school stating that he has panic attacks. Please advise. ? ?CB# [phone] ?

## 2022-02-13 NOTE — Telephone Encounter (Signed)
Informed patient of medication sent to the pharmacy and of letter. He would like it emailed to him after Dr. De Nurse signs it ?

## 2022-02-14 NOTE — Telephone Encounter (Signed)
Letter was emailed to patient.  ?Nothing Further Needed at this time.  ? ?

## 2022-02-18 ENCOUNTER — Encounter: Payer: Self-pay | Admitting: Student

## 2022-02-18 ENCOUNTER — Other Ambulatory Visit: Payer: Self-pay

## 2022-02-18 ENCOUNTER — Ambulatory Visit (INDEPENDENT_AMBULATORY_CARE_PROVIDER_SITE_OTHER): Payer: 59 | Admitting: Student

## 2022-02-18 VITALS — BP 122/60 | HR 78 | Wt 202.0 lb

## 2022-02-18 DIAGNOSIS — E785 Hyperlipidemia, unspecified: Secondary | ICD-10-CM | POA: Insufficient documentation

## 2022-02-18 DIAGNOSIS — Z0000 Encounter for general adult medical examination without abnormal findings: Secondary | ICD-10-CM

## 2022-02-18 DIAGNOSIS — E1169 Type 2 diabetes mellitus with other specified complication: Secondary | ICD-10-CM

## 2022-02-18 DIAGNOSIS — F329 Major depressive disorder, single episode, unspecified: Secondary | ICD-10-CM | POA: Insufficient documentation

## 2022-02-18 DIAGNOSIS — F339 Major depressive disorder, recurrent, unspecified: Secondary | ICD-10-CM | POA: Diagnosis not present

## 2022-02-18 DIAGNOSIS — L649 Androgenic alopecia, unspecified: Secondary | ICD-10-CM

## 2022-02-18 MED ORDER — FINASTERIDE 1 MG PO TABS: ORAL_TABLET | ORAL | 0 refills | Status: DC

## 2022-02-18 NOTE — Assessment & Plan Note (Signed)
PHQ-9 elevated to 23 today, #9 negative. Discussed importance of adhering to medication and therapy, which he has been doing well at. Empathized with him given his difficult current stressors. No changes at this time to be made. Will continue to follow behavioral health's notes and recommendations, agree with Zoloft 150mg  daily and hydroxyzine PRN for insomnia. ?

## 2022-02-18 NOTE — Assessment & Plan Note (Signed)
Refilled Propecia 1mg  daily today. ?

## 2022-02-18 NOTE — Progress Notes (Signed)
? ? ?  SUBJECTIVE:  ? ?CHIEF COMPLAINT / HPI:  ? ?Major Depressive Disorder/ Anxiety ?Feeling very overwhelmed. 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 Has been struggling to balance work, school, etc. And having insomnia as well. Recently re-established care with behavioral health and on Sertraline 150mg  daily, but pt reports taking 100mg  daily. Also takes hydroxyzie PRN at night for sleep which helps. ? ?Sleep apnea ?Wearing CPAP at night. Says mask didn't initially fit well. Received orders/sleep study from pulmonology. ? ?Hyperlipidemia ?Not on a statin. Last lipid panel with elevated LDL, cholesterol, TG in 01/2021. ? ?PERTINENT  PMH / PSH: Reviewed ? ?OBJECTIVE:  ? ?BP 122/60   Pulse 78   Wt 202 lb (91.6 kg)   SpO2 96%   BMI 30.71 kg/m?   ?General: Well-appearing, in no distress ?CV: RRR, no murmurs apprecaited ?Resp: CTAB, normal work of breathing on room air ?Skin: Warm, dry ?Neuro: Alert, oriented x4. No focal neuro deficits ?Psych: Pleasant. Normal mood and affect. ? ?ASSESSMENT/PLAN:  ? ?Hyperlipidemia ?Repeat lipid panel today. Elevated LDL >100 last year. ? ?Major depressive disorder ?PHQ-9 elevated to 23 today, #9 negative. Discussed importance of adhering to medication and therapy, which he has been doing well at. Empathized with him given his difficult current stressors. No changes at this time to be made. Will continue to follow behavioral health's notes and recommendations, agree with Zoloft 150mg  daily and hydroxyzine PRN for insomnia. ? ?Androgenic alopecia ?Refilled Propecia 1mg  daily today. ?  ? ? ? , DO ?[facility]  ? ? ? ?

## 2022-02-18 NOTE — Patient Instructions (Addendum)
It was great seeing you today. ? ?I refilled your finasteride prescription. ? ?Today, we are checking a lipid panel and PSA. If either of these are abnormal, I will call you. ? ?We will plan to see you again next year unless you have any other concerns. ?  ?If you have any questions or concerns, please feel free to call the clinic.  ?  ?Be well,  ?Dr. Darral Dash ?[facility] ?[phone]  ?

## 2022-02-18 NOTE — Assessment & Plan Note (Addendum)
Repeat lipid panel today. Elevated LDL >100 last year. ?

## 2022-02-19 LAB — LIPID PANEL
Chol/HDL Ratio: 5.7 ratio — ABNORMAL HIGH (ref 0.0–5.0)
Cholesterol, Total: 229 mg/dL — ABNORMAL HIGH (ref 100–199)
HDL: 40 mg/dL (ref >39)
LDL Chol Calc (NIH): 94 mg/dL (ref 0–99)
Triglycerides: 568 mg/dL (ref 0–149)
VLDL Cholesterol Cal: 95 mg/dL — ABNORMAL HIGH (ref 5–40)

## 2022-02-19 LAB — PSA, TOTAL AND FREE
PSA, Free Pct: 21 %
PSA, Free: 0.21 ng/mL
Prostate Specific Ag, Serum: 1 ng/mL (ref 0.0–4.0)

## 2022-02-26 ENCOUNTER — Encounter: Payer: Self-pay | Admitting: Student

## 2022-03-06 ENCOUNTER — Encounter: Payer: Self-pay | Admitting: Internal Medicine

## 2022-03-06 ENCOUNTER — Telehealth: Payer: Self-pay | Admitting: Internal Medicine

## 2022-03-06 NOTE — Telephone Encounter (Signed)
Called and spoke with pt about his phone call. Stated to pt since he has not been seen at our office yet that we really needed to have him come in for a sleep consult and he verbalized understanding. Appt has been scheduled for pt with CY tomorrow 4/20 at 3pm. Nothing further needed. ?

## 2022-03-07 ENCOUNTER — Encounter: Payer: Self-pay | Admitting: Internal Medicine

## 2022-03-07 ENCOUNTER — Ambulatory Visit (INDEPENDENT_AMBULATORY_CARE_PROVIDER_SITE_OTHER): Payer: 59 | Admitting: Internal Medicine

## 2022-03-07 VITALS — BP 122/70 | HR 79 | Temp 98.4°F | Ht 68.0 in | Wt 199.2 lb

## 2022-03-07 DIAGNOSIS — G4733 Obstructive sleep apnea (adult) (pediatric): Secondary | ICD-10-CM | POA: Diagnosis not present

## 2022-03-07 DIAGNOSIS — R5383 Other fatigue: Secondary | ICD-10-CM

## 2022-03-07 NOTE — Assessment & Plan Note (Signed)
Discussed sleep habits, indicators of sufficient sleep and role of PAP therapy. ?

## 2022-03-07 NOTE — Patient Instructions (Signed)
Order- DME [facility] please change autopap to 20/16, continue mask of choice, humidifier, supplies, AirView card ? ?Please call as needed ?

## 2022-03-07 NOTE — Progress Notes (Signed)
03/07/22- 51 yoM never smoker for sleep evaluation for concern of OSA courtesy of [employer], DO. ?Medical problem list includes Depression, Anxiety, Hyperlipidemia,  ?Unmarried, lives with father. ?NPSG 08/27/21- AHI 76.2/ hr, desaturation to 77%, body weight 190 lbs, titration required BIPAP 25/21.  ?BIPAP 25/21/ [facility]    new 01/30/22     AirCurve 10 VAuto ?Download compliance 50%, AHI 5.3/ hr     mostly short nights ?Epworth score-14 ?Covid- 2 Moderna ?Flu- no ?Body weight today-199 lbs. ?-----Patient currently wears BIPAP and uses [facility]. States that he thinks the pressure is too high and makes his ear drums hurt ?Parents had OSA. No hx ENT surgery. Denies heart or lung disease. Rarely uses melatonin or trazodone for sleep. Little caffeine. ?Aware of snoring and daytime sleepiness. Bedtime 10-11PM, up 7AM. ?Has lost 26 lbs in las 2 years and trying to lose more.  ? ?Prior to Admission medications   ?Medication Sig Start Date End Date Taking? Authorizing Provider  ?finasteride (PROPECIA) 1 MG tablet TAKE 1 TABLET(1 MG) BY MOUTH DAILY 02/18/22  Yes Dameron, Nolberto Hanlon, DO  ?hydrOXYzine (VISTARIL) 25 MG capsule Take 1 capsule (25 mg total) by mouth daily as needed for anxiety. 02/12/22  Yes Thresa Ross, MD  ?methocarbamol (ROBAXIN) 500 MG tablet Take 1 tablet (500 mg total) by mouth 2 (two) times daily. 11/05/21  Yes Cardama, Amadeo Garnet, MD  ?Multiple Vitamin (MULTI-VITAMIN) tablet    Yes [provider]  ?sertraline (ZOLOFT) 100 MG tablet Take 0.5 tablets (50 mg total) by mouth 2 (two) times daily. 02/11/22  Yes Thresa Ross, MD  ?traZODone (DESYREL) 50 MG tablet TAKE 1 TABLET BY MOUTH EVERY NIGHT AT BEDTIME AS NEEDED FOR SLEEP ?Patient taking differently: Take 50 mg by mouth at bedtime as needed. TAKE 1 TABLET BY MOUTH EVERY NIGHT AT BEDTIME AS NEEDED FOR SLEEP 11/01/21 02/11/22  Thresa Ross, MD  ? ?Past Medical History:  ?Diagnosis Date  ? Sleep apnea   ? ?History reviewed. No pertinent surgical  history. ?History reviewed. No pertinent family history. ?Social History  ? ?Socioeconomic History  ? Marital status: Unknown  ?  Spouse name: Not on file  ? Number of children: Not on file  ? Years of education: Not on file  ? Highest education level: Not on file  ?Occupational History  ? Not on file  ?Tobacco Use  ? Smoking status: Never  ? Smokeless tobacco: Never  ?Vaping Use  ? Vaping Use: Never used  ?Substance and Sexual Activity  ? Alcohol use: Not on file  ? Drug use: Not on file  ? Sexual activity: Not on file  ?Other Topics Concern  ? Not on file  ?Social History Narrative  ? Not on file  ? ?Social Determinants of Health  ? ?Financial Resource Strain: Not on file  ?Food Insecurity: Not on file  ?Transportation Needs: Not on file  ?Physical Activity: Not on file  ?Stress: Not on file  ?Social Connections: Not on file  ?Intimate Partner Violence: Not on file  ? ?ROS-see HPI  + = positive ?Constitutional:    weight loss, night sweats, fevers, chills, fatigue, lassitude. ?HEENT:    +headaches, difficulty swallowing, tooth/dental problems, sore throat,  ?     sneezing, itching,+ ear ache, nasal congestion, post nasal drip, snoring ?CV:    chest pain, orthopnea, PND, swelling in lower extremities, anasarca,  dizziness, palpitations ?Resp:   shortness of breath with exertion or at rest.   ?             productive cough,   non-productive cough, coughing up of blood.   ?           change in color of mucus.  wheezing.   ?Skin:    rash or lesions. ?GI:  No-   heartburn, indigestion, abdominal pain, nausea, vomiting, diarrhea,  ?               change in bowel habits, loss of appetite ?GU: dysuria, change in color of urine, no urgency or frequency.   flank pain. ?MS:   joint pain,+ stiffness, decreased range of motion, back pain. ?Neuro-     nothing unusual ?Psych:  change in mood or affect. + depression or +anxiety.   memory loss. ? ?OBJ- Physical Exa+ ?General- Alert, Oriented,  Affect-appropriate, Distress- none acute, +overweight ?Skin- rash-none, lesions- none, excoriation- none ?Lymphadenopathy- none ?Head- atraumatic ?           Eyes- Gross vision intact, PERRLA, conjunctivae and secretions clear ?           Ears- Hearing, canals-normal ?           Nose- Clear, no-Septal dev, mucus, polyps, erosion, perforation  ?           Throat- Mallampati III , mucosa clear , drainage- none, tonsils- atrophic, + teeth ?Neck- flexible , trachea midline, no stridor , thyroid nl, carotid no bruit ?Chest - symmetrical excursion , unlabored ?          Heart/CV- RRR , no murmur , no gallop  , no rub, nl s1 s2 ?                          - JVD- none , edema- none, stasis changes- none, varices- none ?          Lung- clear to P&A, wheeze- none, cough- none , dullness-none, rub- none ?          Chest wall-  ?Abd-  ?Br/ Gen/ Rectal- Not done, not indicated ?Extrem- cyanosis- none, clubbing, none, atrophy- none, strength- nl ?Neuro- grossly intact to observation ? ?

## 2022-03-07 NOTE — Assessment & Plan Note (Signed)
Appropriate discussion including education on OSA, medical issues and treatment choices, safe driving responsibility. ?Plan- We are going to reduce BIPAP to 20/16. We may take advantage of his AirCurve machine to switch to autoBipap later. For now we need to get him comfortable before he gives up and quits.  ?

## 2022-03-26 ENCOUNTER — Other Ambulatory Visit: Payer: Self-pay | Admitting: Student

## 2022-04-23 ENCOUNTER — Encounter: Payer: Self-pay | Admitting: *Deleted

## 2022-04-29 ENCOUNTER — Telehealth (HOSPITAL_COMMUNITY): Payer: Self-pay

## 2022-04-29 DIAGNOSIS — F411 Generalized anxiety disorder: Secondary | ICD-10-CM

## 2022-04-29 DIAGNOSIS — F331 Major depressive disorder, recurrent, moderate: Secondary | ICD-10-CM

## 2022-04-29 MED ORDER — SERTRALINE HCL 100 MG PO TABS: 50.0000 mg | ORAL_TABLET | Freq: Two times a day (BID) | ORAL | 0 refills | Status: DC

## 2022-04-29 MED ORDER — HYDROXYZINE PAMOATE 25 MG PO CAPS: 25.0000 mg | ORAL_CAPSULE | Freq: Every day | ORAL | 0 refills | Status: DC | PRN

## 2022-04-29 NOTE — Telephone Encounter (Signed)
Medication refills - Telephone call with pt, after he left a message his insurance lapsed and he needs to get his refills, new orders for Hydroxyzine and Sertraline filled at the [facility], located at [address]. in [place], [place].  Patient stated that was the 2 prescriptions he needed new orders and to be sent to the [facility] and patient aware of next appointment set for 05/13/22.

## 2022-04-29 NOTE — Telephone Encounter (Signed)
Medication refill  Telephone call with patient to inform Dr. Gilmore Laroche approved one time refills of his Sertraline 100 mg and Hydroxyzine 25 mg  and both 1 time orders e-scribed to his requested [facility] in [place], [place].  Both orders e-scribed as verbally approved by Dr. Gilmore Laroche this date.  Collateral to call back if any issues filling orders.

## 2022-05-02 ENCOUNTER — Encounter: Payer: Self-pay | Admitting: Internal Medicine

## 2022-05-02 DIAGNOSIS — G4733 Obstructive sleep apnea (adult) (pediatric): Secondary | ICD-10-CM

## 2022-05-03 NOTE — Telephone Encounter (Signed)
Order- DME- please reduce BIPAP to 15/12, PS 2

## 2022-05-13 ENCOUNTER — Telehealth (INDEPENDENT_AMBULATORY_CARE_PROVIDER_SITE_OTHER): Payer: 59 | Admitting: Psychiatry

## 2022-05-13 ENCOUNTER — Encounter (HOSPITAL_COMMUNITY): Payer: Self-pay | Admitting: Psychiatry

## 2022-05-13 DIAGNOSIS — F1090 Alcohol use, unspecified, uncomplicated: Secondary | ICD-10-CM

## 2022-05-13 DIAGNOSIS — F411 Generalized anxiety disorder: Secondary | ICD-10-CM

## 2022-05-13 DIAGNOSIS — Z789 Other specified health status: Secondary | ICD-10-CM | POA: Diagnosis not present

## 2022-05-13 DIAGNOSIS — F331 Major depressive disorder, recurrent, moderate: Secondary | ICD-10-CM

## 2022-06-05 NOTE — Progress Notes (Deleted)
03/07/22- 51 yoM never smoker for sleep evaluation for concern of OSA courtesy of Darral Dash, DO. Medical problem list includes Depression, Anxiety, Hyperlipidemia,  Unmarried, lives with father. NPSG 08/27/21- AHI 76.2/ hr, desaturation to 77%, body weight 190 lbs, titration required BIPAP 25/21.  BIPAP 25/21/ [facility]    new 01/30/22     AirCurve 10 VAuto Download compliance 50%, AHI 5.3/ hr     mostly short nights Epworth score-14 Covid- 2 Moderna Flu- no Body weight today-199 lbs. -----Patient currently wears BIPAP and uses [facility]. States that he thinks the pressure is too high and makes his ear drums hurt Parents had OSA. No hx ENT surgery. Denies heart or lung disease. Rarely uses melatonin or trazodone for sleep. Little caffeine. Aware of snoring and daytime sleepiness. Bedtime 10-11PM, up 7AM. Has lost 26 lbs in las 2 years and trying to lose more.   06/06/22- 51 yoM never smoker followed for OSA, complicated by  Depression, Anxiety, Hyperlipidemia,  BIPAP 15/12/ [facility]  Download compliance- Body weight today-    ROS-see HPI  + = positive Constitutional:    weight loss, night sweats, fevers, chills, fatigue, lassitude. HEENT:    +headaches, difficulty swallowing, tooth/dental problems, sore throat,       sneezing, itching,+ ear ache, nasal congestion, post nasal drip, snoring CV:    chest pain, orthopnea, PND, swelling in lower extremities, anasarca,                                  dizziness, palpitations Resp:   shortness of breath with exertion or at rest.                productive cough,   non-productive cough, coughing up of blood.              change in color of mucus.  wheezing.   Skin:    rash or lesions. GI:  No-   heartburn, indigestion, abdominal pain, nausea, vomiting, diarrhea,                 change in bowel habits, loss of appetite GU: dysuria, change in color of urine, no urgency or frequency.   flank pain. MS:   joint pain,+ stiffness, decreased range of  motion, back pain. Neuro-     nothing unusual Psych:  change in mood or affect. + depression or +anxiety.   memory loss.  OBJ- Physical Exa+ General- Alert, Oriented, Affect-appropriate, Distress- none acute, +overweight Skin- rash-none, lesions- none, excoriation- none Lymphadenopathy- none Head- atraumatic            Eyes- Gross vision intact, PERRLA, conjunctivae and secretions clear            Ears- Hearing, canals-normal            Nose- Clear, no-Septal dev, mucus, polyps, erosion, perforation             Throat- Mallampati III , mucosa clear , drainage- none, tonsils- atrophic, + teeth Neck- flexible , trachea midline, no stridor , thyroid nl, carotid no bruit Chest - symmetrical excursion , unlabored           Heart/CV- RRR , no murmur , no gallop  , no rub, nl s1 s2                           - JVD- none , edema- none, stasis  changes- none, varices- none           Lung- clear to P&A, wheeze- none, cough- none , dullness-none, rub- none           Chest wall-  Abd-  Br/ Gen/ Rectal- Not done, not indicated Extrem- cyanosis- none, clubbing, none, atrophy- none, strength- nl Neuro- grossly intact to observation

## 2022-06-06 ENCOUNTER — Ambulatory Visit: Payer: 59 | Admitting: Internal Medicine

## 2022-06-25 ENCOUNTER — Ambulatory Visit (INDEPENDENT_AMBULATORY_CARE_PROVIDER_SITE_OTHER): Payer: 59 | Admitting: Psychiatry

## 2022-06-25 ENCOUNTER — Encounter (HOSPITAL_COMMUNITY): Payer: Self-pay | Admitting: Psychiatry

## 2022-06-25 VITALS — BP 108/68 | Temp 98.6°F | Ht 68.0 in | Wt 198.0 lb

## 2022-06-25 DIAGNOSIS — F411 Generalized anxiety disorder: Secondary | ICD-10-CM

## 2022-06-25 DIAGNOSIS — Z658 Other specified problems related to psychosocial circumstances: Secondary | ICD-10-CM

## 2022-06-25 DIAGNOSIS — F331 Major depressive disorder, recurrent, moderate: Secondary | ICD-10-CM

## 2022-06-25 MED ORDER — SERTRALINE HCL 100 MG PO TABS: 50.0000 mg | ORAL_TABLET | Freq: Two times a day (BID) | ORAL | 1 refills | Status: DC

## 2022-06-25 MED ORDER — BUPROPION HCL ER (SR) 100 MG PO TB12: 100.0000 mg | ORAL_TABLET | Freq: Every day | ORAL | 1 refills | Status: DC

## 2022-06-25 MED ORDER — HYDROXYZINE HCL 10 MG PO TABS: 10.0000 mg | ORAL_TABLET | Freq: Three times a day (TID) | ORAL | 0 refills | Status: DC | PRN

## 2022-06-25 MED ORDER — HYDROXYZINE HCL 10 MG PO TABS
10.0000 mg | ORAL_TABLET | Freq: Three times a day (TID) | ORAL | 1 refills | Status: DC | PRN
Start: 2022-06-25 — End: 2022-10-30

## 2022-06-25 NOTE — Progress Notes (Signed)
[facility] Follow up visit  Patient Identification: Theodore [name] MRN:  [id number] Date of Evaluation:  06/25/2022 Referral Source: [facility] Chief Complaint: follow up depression Visit Diagnosis:    ICD-10-CM   1. MDD (major depressive disorder), recurrent episode, moderate (HCC)  F33.1 sertraline (ZOLOFT) 100 MG tablet    2. GAD (generalized anxiety disorder)  F41.1     3. Psychosocial distress  Z65.8             History of Present Illness: Patient is a 51 years old currently single Caucasian male initially referred by last psychiatrist or [facility] Dr Nila Nephew as he has private insurance now and could not be seen at [facility].  He has been diagnosed with depression and anxiety   Has been doing fair but recenly recurrence of legal stress related to court case with past accusations from a family Handling stress but understandably stressed due to court case coming up   Feeling subdued but not hopeless. Says court cases and legal concerns has effected his life, his growth and his opportunities He wants to continue move forward and away from negative or wrong accusations  He does have a lawyer Trying to help his dad and worry if something happens or if he goes to prison who will take care of his dad Vistaril 25mg  made him too sleepy  Feels foggy at times, says post covid possible , decrease motivation  Aggravating factor: difficult childood, moms death 5 years ago, dad sick, legal cases  Modifying factor: music, dad Severity subdued due to legal concerns pending and ongoing  Past Psychiatric History: depression, anxiety  Previous Psychotropic Medications: No   Substance Abuse History in the last 12 months:  Yes.    Consequences of Substance Abuse: have cut down alcohol, understands its effect on mood, memory, depression  Past Medical History:  Past Medical History:  Diagnosis Date   Sleep apnea     History reviewed. No pertinent surgical history.  Family Psychiatric History: mom  : depression, dementia  Family History: History reviewed. No pertinent family history.  Social History:   Social History   Socioeconomic History   Marital status: Unknown    Spouse name: Not on file   Number of children: Not on file   Years of education: Not on file   Highest education level: Not on file  Occupational History   Not on file  Tobacco Use   Smoking status: Never   Smokeless tobacco: Never  Vaping Use   Vaping Use: Never used  Substance and Sexual Activity   Alcohol use: Not on file   Drug use: Not on file   Sexual activity: Not on file  Other Topics Concern   Not on file  Social History Narrative   Not on file   Social Determinants of Health   Financial Resource Strain: Not on file  Food Insecurity: Not on file  Transportation Needs: Not on file  Physical Activity: Not on file  Stress: Not on file  Social Connections: Not on file      Allergies:  No Known Allergies  Metabolic Disorder Labs: Lab Results  Component Value Date   HGBA1C 4.8 01/25/2021   No results found for: "PROLACTIN" Lab Results  Component Value Date   CHOL 229 (H) 02/18/2022   TRIG 568 (HH) 02/18/2022   HDL 40 02/18/2022   CHOLHDL 5.7 (H) 02/18/2022   LDLCALC 94 02/18/2022   LDLCALC 129 (H) 01/25/2021   Lab Results  Component Value Date  TSH 2.830 01/25/2021    Therapeutic Level Labs: No results found for: "LITHIUM" No results found for: "CBMZ" No results found for: "VALPROATE"  Current Medications: Current Outpatient Medications  Medication Sig Dispense Refill   buPROPion ER (WELLBUTRIN SR) 100 MG 12 hr tablet Take 1 tablet (100 mg total) by mouth daily. 30 tablet 1   finasteride (PROPECIA) 1 MG tablet TAKE 1 TABLET(1 MG) BY MOUTH DAILY 90 tablet 0   hydrOXYzine (VISTARIL) 25 MG capsule Take 1 capsule (25 mg total) by mouth daily as needed for anxiety. 30 capsule 0   methocarbamol (ROBAXIN) 500 MG tablet Take 1 tablet (500 mg total) by mouth 2 (two) times  daily. 20 tablet 0   Multiple Vitamin (MULTI-VITAMIN) tablet      hydrOXYzine (ATARAX) 10 MG tablet Take 1 tablet (10 mg total) by mouth 3 (three) times daily as needed. 30 tablet 1   sertraline (ZOLOFT) 100 MG tablet Take 0.5 tablets (50 mg total) by mouth 2 (two) times daily. 30 tablet 1   No current facility-administered medications for this visit.     Psychiatric Specialty Exam: Review of Systems  Respiratory:  Negative for apnea.   Cardiovascular:  Negative for chest pain.  Neurological:  Negative for tremors.  Psychiatric/Behavioral:  Negative for agitation and self-injury.     Blood pressure 108/68, temperature 98.6 F (37 C), height 5\' 8"  (1.727 m), weight 198 lb (89.8 kg).Body mass index is 30.11 kg/m.  General Appearance:casual  Eye Contact:  fair  Speech:  Normal Rate  Volume:  Decreased  Mood:  somewhat subdued  Affect:  fair  Thought Process:  Goal Directed  Orientation:  Full (Time, Place, and Person)  Thought Content:  Rumination  Suicidal Thoughts:  No  Homicidal Thoughts:  No  Memory:  Immediate;   Fair Recent;   Fair  Judgement:  Fair  Insight:  Fair  Psychomotor Activity:  Normal  Concentration:  Concentration: Fair and Attention Span: Fair  Recall:  of Knowledge:Good  Language: Good  Akathisia:  No  Handed:    AIMS (if indicated):  not done  Assets:  Desire for Improvement  ADL's:  Intact  Cognition: WNL  Sleep:  Fair   Screenings: PHQ2-9    Flowsheet Row Office Visit from 02/18/2022 in [facility] Office Visit from 09/28/2021 in [facility] Office Visit from 01/25/2021 in [facility] Video Visit from 01/16/2021 in [facility] AT [place]  PHQ-2 Total Score 6 1 4 1   PHQ-9 Total Score 20 10 18  --      Flowsheet Row Office Visit from 06/25/2022 in [facility] AT [place] Video Visit from 05/13/2022 in [facility]AL [facility]  OUTPATIENT CENTER AT [place] Video Visit from 02/11/2022 in [facility] OUT[facility] AT [place]  C-SSRS RISK CATEGORY No Risk No Risk No Risk       Assessment and Plan: as follows  Prior documentation reviewed    MDD recurrent moderate : subdued due to stressors, decrease motivation at time when wakes up, discussed compliance with BIPap Will add wellbutrin for depression, amotivation, continue zoloft      Generalized anxiety disorder; stressed due to above concerns, continue zoloft, consider therapy and will lower vistaril to 10mg  so doesn't make him sleepy    Alcohol use: stays have not used since or before last visit, wants it not to make him foggy and understands the risk  Discussed relapse prevention  Fu 6 weeks or earlier if needed Recommend to start therpay  Direct care time spent in office including face to face, chart review and documentation Thresa Ross, MD 8/8/20234:17 PM

## 2022-08-15 ENCOUNTER — Other Ambulatory Visit: Payer: Self-pay | Admitting: Student

## 2022-08-15 MED ORDER — FINASTERIDE 1 MG PO TABS: ORAL_TABLET | ORAL | 0 refills | Status: DC

## 2022-08-19 ENCOUNTER — Ambulatory Visit (INDEPENDENT_AMBULATORY_CARE_PROVIDER_SITE_OTHER): Payer: 59 | Admitting: Licensed Clinical Social Worker

## 2022-08-19 ENCOUNTER — Encounter (HOSPITAL_COMMUNITY): Payer: Self-pay

## 2022-08-19 DIAGNOSIS — F331 Major depressive disorder, recurrent, moderate: Secondary | ICD-10-CM

## 2022-08-19 DIAGNOSIS — F411 Generalized anxiety disorder: Secondary | ICD-10-CM

## 2022-08-19 NOTE — Plan of Care (Signed)
  Problem: Anxiety Disorder CCP Problem  1 self confidence/esteem Goal:  Blaiden will manage mood and anxiety as evidenced by improving self confidence/esteem regardless of what others think, finishing projects/reduce procrastination, reduce impulsive spending/thoughts, challenge negative thoughts for 5 out of 7 days for 60.  Outcome: Initial Goal: STG: Patient will practice problem solving skills 3 times per week for the next 4 weeks Outcome: Initial

## 2022-08-20 NOTE — Progress Notes (Signed)
Comprehensive Clinical Assessment (CCA) Note  08/20/2022 Theodore [name] [id number]  Chief Complaint:  Chief Complaint  Patient presents with   Depression   Anxiety   Visit Diagnosis: MDD (major depressive disorder), recurrent episode, moderate (HCC)  GAD (generalized anxiety disorder)     CCA Biopsychosocial Intake/Chief Complaint:  Mood, Anxiety  Current Symptoms/Problems: Mood: can feel "bleh" at times, rarely cries, feels empty, difficulty falling asleep at times-has sleep apnea, appetite is good, has lost weight with exercise (20 lbs), irritabilty at times, feelings of worthlessness at times, rare feelings of hopelessness, difficulty with concentration,  some feelings of fatigue,      Anxiety: worried, nervous, fearful,  struggles with feelings trapped/stuck in a situation or circumstance, panic attacks, feels looked at or judged in public, not feeling like himself or who he wants to be, not feeling good enough, patient has been falsely accused for various acts including inappropriate relationships with others, violent threats, etc   Patient Reported Schizophrenia/Schizoaffective Diagnosis in Past: No   Strengths: musically talented, Estate agent work/cad work, tries to be resonable calm person, used to be a people person (still can be), in good shape, good relationships with father  Preferences: doesn't prefer drama/conflict, prefers the outdoors, prefers time to himself and prefers to be with others  Abilities: sing, Surveyor, minerals, enjoyed Chief Financial Officer, physics   Type of Services Patient Feels are Needed: Therapy   Initial Clinical Notes/Concerns: Symptoms started around age 26 but increased after accusations started around 2015, symptoms occur 5-6 out of 7 days, symptoms are moderate to severe per patient   Mental Health Symptoms Depression:   Increase/decrease in appetite; Sleep (too much or little); Irritability; Hopelessness; Worthlessness; Difficulty Concentrating   Duration of  Depressive symptoms:  Greater than two weeks   Mania:   None   Anxiety:    Worrying; Tension; Restlessness; Difficulty concentrating   Psychosis:   None   Duration of Psychotic symptoms: No data recorded  Trauma:  None   Obsessions:  None   Compulsions:  None   Inattention:  None   Hyperactivity/Impulsivity:  None   Oppositional/Defiant Behaviors:  None   Emotional Irregularity:  None   Other Mood/Personality Symptoms:  None    Mental Status Exam Appearance and self-care  Stature:  Average   Weight:  Average weight   Clothing:  Casual   Grooming:  Normal   Cosmetic use:  None   Posture/gait:  Normal   Motor activity:  Not Remarkable   Sensorium  Attention:  Normal   Concentration:  Normal   Orientation:  X5   Recall/memory:  Normal   Affect and Mood  Affect:  Anxious   Mood:  Depressed; Anxious   Relating  Eye contact:  Normal   Facial expression:  Responsive   Attitude toward examiner:  Cooperative   Thought and Language  Speech flow: Normal   Thought content:  Appropriate to Mood and Circumstances   Preoccupation:  None   Hallucinations:  None   Organization:  No data recorded  [employer] of Knowledge:  Good   Intelligence:  Average   Abstraction:  Normal   Judgement:  Fair   Dance movement psychotherapist:  Realistic   Insight:  Good   Decision Making:  Impulsive; Normal   Social Functioning  Social Maturity:  Responsible; Isolates   Social Judgement:  Normal   Stress  Stressors:  Legal   Coping Ability:  Human resources officer Deficits:  None   Supports:  Church; Scientist, forensic system;  Family     Religion: Religion/Spirituality Are You A Religious Person?: Yes What is Your Religious Affiliation?: Jehovah's Witness How Might This Affect Treatment?: Support in treatment  Leisure/Recreation: Leisure / Recreation Do You Have Hobbies?: Yes Leisure and Hobbies: cook, travel, bike,  hike  Exercise/Diet: Exercise/Diet Do You Exercise?: Yes What Type of Exercise Do You Do?: Weight Training, Run/Walk, Swimming How Many Times a Week Do You Exercise?: 4-5 times a week Have You Gained or Lost A Significant Amount of Weight in the Past Six Months?: Yes-Lost Number of Pounds Lost?: 20 Do You Follow a Special Diet?: Yes Type of Diet: Intermittant fasting, reducing sugars Do You Have Any Trouble Sleeping?: Yes Explanation of Sleeping Difficulties: difficulty falling asleep at times, sleep apnea   CCA Employment/Education Employment/Work Situation: Employment / Work Situation Employment Situation: Employed 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, works with musicians How Long has Patient Been Employed?: 30 years Are You Satisfied With Your Job?: Yes Do You Work More Than One Job?: Yes Work Stressors: None Patient's Job has Been Impacted by Current Illness: No What is the Longest Time Patient has Held a Job?: 30 years Where was the Patient Employed at that Time?: self employed  Education: Education Is Patient Currently Attending School?: Yes School Currently Attending: [employer] Last Grade Completed: 12 Name of High School: [employer]: Home school Did Garment/textile technologist From [employer]?: Yes Did Theme park manager?: Yes What Type of College Degree Do you Have?: Associates Did You Attend Graduate School?: No 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, Did You Have Any Special Interests In School?: Math, physics, etc Did You Have An Individualized Education Program (IIEP): No Did You Have Any Difficulty At School?: No Patient's Education Has Been Impacted by Current Illness: No   CCA Family/Childhood History Family and Relationship History: Family history Marital status: Single Are you sexually active?: No What is your sexual orientation?: Heterosexual Has your sexual activity been affected by drugs, alcohol, medication, or emotional stress?: None Does patient have  children?: No  Childhood History:  Childhood History By whom was/is the patient raised?: Both parents Additional childhood history information: Both parents in the home. Patient describes childhood as "fun." Description of patient's relationship with caregiver when they were a child: Father: good, Mother: good Patient's description of current relationship with people who raised him/her: Father: good, Mother: deceased How were you disciplined when you got in trouble as a child/adolescent?: spanked, talked to, things taken away, grounded Does patient have siblings?: No Did patient suffer any verbal/emotional/physical/sexual abuse as a child?:  (People in school were verbally abusive) Did patient suffer from severe childhood neglect?: No Has patient ever been sexually abused/assaulted/raped as an adolescent or adult?: No Was the patient ever a victim of a crime or a disaster?: No Witnessed domestic violence?: No Has patient been affected by domestic violence as an adult?: No  Child/Adolescent Assessment:     CCA Substance Use Alcohol/Drug Use: Alcohol / Drug Use Pain Medications: See patient MAR Prescriptions: See patient MAR Over the Counter: See patient MAR History of alcohol / drug use?: No history of alcohol / drug abuse                         ASAM's:  Six Dimensions of Multidimensional Assessment  Dimension 1:  Acute Intoxication and/or Withdrawal Potential:   Dimension 1:  Description of individual's past and current experiences of substance use and withdrawal: None  Dimension 2:  Biomedical Conditions and Complications:   Dimension 2:  Description of patient's biomedical conditions and  complications: None  Dimension 3:  Emotional, Behavioral, or Cognitive Conditions and Complications:  Dimension 3:  Description of emotional, behavioral, or cognitive conditions and complications: None  Dimension 4:  Readiness to Change:  Dimension 4:  Description of Readiness to  Change criteria: None  Dimension 5:  Relapse, Continued use, or Continued Problem Potential:  Dimension 5:  Relapse, continued use, or continued problem potential critiera description: None  Dimension 6:  Recovery/Living Environment:  Dimension 6:  Recovery/Iiving environment criteria description: None  ASAM Severity Score: ASAM's Severity Rating Score: 0  ASAM Recommended Level of Treatment:     Substance use Disorder (SUD)    Recommendations for Services/Supports/Treatments: Recommendations for Services/Supports/Treatments Recommendations For Services/Supports/Treatments: Individual Therapy, Medication Management  DSM5 Diagnoses: Patient Active Problem List   Diagnosis Date Noted   Hyperlipidemia 02/18/2022   Major depressive disorder 02/18/2022   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 Merian Capron  Patient/Guardian was advised Release of Information must be obtained prior to any record release in order to collaborate their care with an outside provider. Patient/Guardian was advised if they have not already done so to contact the registration department to sign all necessary forms in order for [place] to release information regarding their care.   Consent: Patient/Guardian gives verbal consent for treatment and assignment of benefits for services provided during this visit. Patient/Guardian expressed understanding and agreed to proceed.   Glori Bickers, LCSW

## 2022-08-26 ENCOUNTER — Telehealth (HOSPITAL_COMMUNITY): Payer: 59 | Admitting: Psychiatry

## 2022-09-02 ENCOUNTER — Other Ambulatory Visit (HOSPITAL_COMMUNITY): Payer: Self-pay

## 2022-09-02 DIAGNOSIS — F331 Major depressive disorder, recurrent, moderate: Secondary | ICD-10-CM

## 2022-09-02 MED ORDER — SERTRALINE HCL 100 MG PO TABS: 50.0000 mg | ORAL_TABLET | Freq: Two times a day (BID) | ORAL | 0 refills | Status: DC

## 2022-09-17 ENCOUNTER — Telehealth (HOSPITAL_COMMUNITY): Payer: Self-pay | Admitting: Licensed Clinical Social Worker

## 2022-09-17 NOTE — Telephone Encounter (Signed)
Patient called requesting call from therapist to discuss obtaining documentation of current diagnoses/treatment needs/regimen to suppply to disability office at [facility]. [phone].

## 2022-09-27 ENCOUNTER — Ambulatory Visit (INDEPENDENT_AMBULATORY_CARE_PROVIDER_SITE_OTHER): Payer: Self-pay | Admitting: Licensed Clinical Social Worker

## 2022-09-27 DIAGNOSIS — F411 Generalized anxiety disorder: Secondary | ICD-10-CM

## 2022-09-27 DIAGNOSIS — F331 Major depressive disorder, recurrent, moderate: Secondary | ICD-10-CM

## 2022-09-28 NOTE — Progress Notes (Signed)
   THERAPIST PROGRESS NOTE  Session Time: 9:01 am-9:50 am  Type of Therapy: Individual Therapy  Purpose of session: Addison will manage mood and anxiety as evidenced by improving self confidence/esteem regardless of what others think, finishing projects/reduce procrastination, reduce impulsive spending/thoughts, challenge negative thoughts for 5 out of 7 days for 60.   ProgressTowards Goals: Initial  Interventions: Therapist utilized CBT, Solution focused brief therapy, and ACT to address anxiety and depression. Therapist provided support and empathy to patient during session. Therapist worked with patient to complete a life map including: 1) what or who is most important to you, 2) What thoughts, feelings, or sensations get in the way of moving forward, 3) what do you do to move away from those difficult inner experiences, and 4) what could you do to move toward who's important to you?  Effectiveness: Patient was oriented x4 (person, place, situation, and time). Patient was alert, engaged, pleasant, and cooperative. Patient was casually dressed, and appropriately groomed. Patient is in need of a letter for accommodations for school related to his anxiety and depression. Patient was able to identify what was important to him including: relationship with God, his father, caring for his father, reputation, expertise, loyalty, respect, helping others, staying active, music, and friends. Patient identified what gets in the way: feeling less than, judgment/past memories/false accusations, feeling overwhelmed, thoughts of giving up (non-suicidal thoughts), and difficulty with memory. Patient noted when he feels these internal experiences he will: shutdown/take a lot of down time, isolate, avoid, occasionally have a small amount of wine, and occasionally just go to sleep. Patient noted that to take a step toward what he wants in life he can focus on balance in work and balance in exercise.  Patient engaged in  session. Patient responded well to interventions. Patient continues to meet criteria for MDD (major depressive disorder), recurrent episode, moderate, and Generalized anxiety disorder. Patient will continue in outpatient therapy due to being the least restrictive service to met his needs. Patient made minimal progress on his goals.   Suicidal/Homicidal: Nowithout intent/plan  Plan: Return again in 2-4 weeks.  Diagnosis: MDD (major depressive disorder), recurrent episode, moderate (HCC)  GAD (generalized anxiety disorder)  Collaboration of Care: Psychiatrist AEB Dr. Merian Capron  Patient/Guardian was advised Release of Information must be obtained prior to any record release in order to collaborate their care with an outside provider. Patient/Guardian was advised if they have not already done so to contact the registration department to sign all necessary forms in order for [place] to release information regarding their care.   Consent: Patient/Guardian gives verbal consent for treatment and assignment of benefits for services provided during this visit. Patient/Guardian expressed understanding and agreed to proceed.   Glori Bickers, LCSW 09/28/2022

## 2022-10-07 ENCOUNTER — Other Ambulatory Visit (HOSPITAL_COMMUNITY): Payer: Self-pay

## 2022-10-07 DIAGNOSIS — F331 Major depressive disorder, recurrent, moderate: Secondary | ICD-10-CM

## 2022-10-07 MED ORDER — SERTRALINE HCL 100 MG PO TABS: 50.0000 mg | ORAL_TABLET | Freq: Two times a day (BID) | ORAL | 0 refills | Status: DC

## 2022-10-14 ENCOUNTER — Ambulatory Visit (INDEPENDENT_AMBULATORY_CARE_PROVIDER_SITE_OTHER): Payer: Self-pay | Admitting: Licensed Clinical Social Worker

## 2022-10-14 DIAGNOSIS — F411 Generalized anxiety disorder: Secondary | ICD-10-CM

## 2022-10-14 DIAGNOSIS — F331 Major depressive disorder, recurrent, moderate: Secondary | ICD-10-CM

## 2022-10-15 NOTE — Progress Notes (Signed)
Virtual Visit via Video Note  I connected with Baltazar Apo on 10/15/22 at  1:00 PM EST by a video enabled telemedicine application and verified that I am speaking with the correct person using two identifiers.  Location: Patient: Home Provider: Office   I discussed the limitations of evaluation and management by telemedicine and the availability of in person appointments. The patient expressed understanding and agreed to proceed.  THERAPIST PROGRESS NOTE  Session Time: 1:00 pm-1:45 pm  Type of Therapy: Individual Therapy  Purpose of session: Rielly will manage mood and anxiety as evidenced by improving self confidence/esteem regardless of what others think, finishing projects/reduce procrastination, reduce impulsive spending/thoughts, challenge negative thoughts for 5 out of 7 days for 60.   ProgressTowards Goals: Progressing  Interventions: Therapist utilized CBT, Solution focused brief therapy, and ACT to address anxiety and depression. Therapist provided support and empathy to patient during session. Therapist processed patient's feelings to identify triggers for anxiety. Therapist worked with patient to challenge all or nothing thinking.   Effectiveness: Patient was oriented x4 (person, place, situation, and time). Patient was casually dressed, and appropriately groomed. Patient was alert, engaged, anxious, and cooperative. Patient noted that he traveled to the beach the previous week for Thanksgiving. 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 Patient travelled back and then didn't feel like doing much. He felt very comfortable in bed and feels safe. Patient feels like once he gets in this state he is unable to do other things. Patient understood this is all or nothing, black or white thinking. Patient understood that he can identify small steps to take to accomplish tasks around the house without overwhelming himself.    Patient engaged in session. Patient responded well to interventions. Patient continues to meet criteria for MDD (major depressive disorder), recurrent episode, moderate, and Generalized anxiety disorder. Patient will continue in outpatient therapy due to being the least restrictive service to met his needs. Patient made minimal progress on his goals.   Suicidal/Homicidal: Nowithout intent/plan  Plan: Return again in 2-4 weeks. Patient will focus on small steps to accomplish chores, etc while also allowing time to relax.   Diagnosis: MDD (major depressive disorder), recurrent episode, moderate (HCC)  GAD (generalized anxiety disorder)  Collaboration of Care: Psychiatrist AEB Dr. Merian Capron  Patient/Guardian was advised Release of Information must be obtained prior to any record release in order to collaborate their care with an outside provider. Patient/Guardian was advised if they have not already done so to contact the registration department to sign all necessary forms in order for [place] to release information regarding their care.   Consent: Patient/Guardian gives verbal consent for treatment and assignment of benefits for services provided during this visit. Patient/Guardian expressed understanding and agreed to proceed.   I discussed the assessment and treatment plan with the patient. The patient was provided an opportunity to ask questions and all were answered. The patient agreed with the plan and demonstrated an understanding of the instructions.   The patient was advised to call back or seek an in-person evaluation if the symptoms worsen or if the condition fails to improve as anticipated.  I provided 45 minutes of non-face-to-face time during this encounter.  Glori Bickers, LCSW 10/15/2022

## 2022-10-30 ENCOUNTER — Telehealth (INDEPENDENT_AMBULATORY_CARE_PROVIDER_SITE_OTHER): Payer: Self-pay | Admitting: Psychiatry

## 2022-10-30 ENCOUNTER — Encounter (HOSPITAL_COMMUNITY): Payer: Self-pay | Admitting: Psychiatry

## 2022-10-30 DIAGNOSIS — F411 Generalized anxiety disorder: Secondary | ICD-10-CM

## 2022-10-30 DIAGNOSIS — Z658 Other specified problems related to psychosocial circumstances: Secondary | ICD-10-CM

## 2022-10-30 DIAGNOSIS — F331 Major depressive disorder, recurrent, moderate: Secondary | ICD-10-CM

## 2022-10-30 DIAGNOSIS — Z789 Other specified health status: Secondary | ICD-10-CM

## 2022-10-30 MED ORDER — HYDROXYZINE HCL 10 MG PO TABS: 10.0000 mg | ORAL_TABLET | Freq: Every day | ORAL | 0 refills | Status: DC | PRN

## 2022-10-30 MED ORDER — SERTRALINE HCL 100 MG PO TABS: 100.0000 mg | ORAL_TABLET | Freq: Every day | ORAL | 1 refills | Status: DC

## 2022-10-30 MED ORDER — BUPROPION HCL ER (SR) 150 MG PO TB12: 150.0000 mg | ORAL_TABLET | Freq: Every day | ORAL | 1 refills | Status: DC

## 2022-10-30 NOTE — Progress Notes (Signed)
[facility] Follow up visit  Patient Identification: Theodore [name] MRN:  [id number] Date of Evaluation:  10/30/2022 Referral Source: [facility] Chief Complaint: follow up depression Visit Diagnosis:    ICD-10-CM   1. GAD (generalized anxiety disorder)  F41.1     2. MDD (major depressive disorder), recurrent episode, moderate (HCC)  F33.1 sertraline (ZOLOFT) 100 MG tablet    3. Psychosocial distress  Z65.8     4. Alcohol use  Z78.9         Virtual Visit via Video Note  I connected with Theodore [name] on 10/30/22 at  2:30 PM EST by a video enabled telemedicine application and verified that I am speaking with the correct person using two identifiers.  Location: Patient: car Provider: home office   I discussed the limitations of evaluation and management by telemedicine and the availability of in person appointments. The patient expressed understanding and agreed to proceed.      I discussed the assessment and treatment plan with the patient. The patient was provided an opportunity to ask questions and all were answered. The patient agreed with the plan and demonstrated an understanding of the instructions.   The patient was advised to call back or seek an in-person evaluation if the symptoms worsen or if the condition fails to improve as anticipated.  I provided 15 minutes of non-face-to-face time during this encounter.     History of Present Illness: Patient is a 51 years old currently single Caucasian male initially referred by last psychiatrist or [facility] Dr Theodore [name] as he has private insurance now and could not be [facility] at [facility].  He has been diagnosed with depression and anxiety   Somewhat subdued last visit added wellbutrin 100mg  Still some subdued, family legal issue and regular stressors with school and helping out dad  Overall decrease motivation despite using CPAp feels tired  Has stopped alcohol couple of months ago   Feeling subdued but not hopeless. Says court cases  and legal concerns has effected his life, his growth and his opportunities He wants to continue move forward and away from negative or wrong accusations   Aggravating factor: difficult childood, moms death 5 years ago, dad sick, legal cases  Modifying factor: music, dad Severity subdued  Past Psychiatric History: depression, anxiety  Previous Psychotropic Medications: No   Substance Abuse History in the last 12 months:  Yes.    Consequences of Substance Abuse: have cut down alcohol, understands its effect on mood, memory, depression  Past Medical History:  Past Medical History:  Diagnosis Date   Sleep apnea     History reviewed. No pertinent surgical history.  Family Psychiatric History: mom : depression, dementia  Family History: History reviewed. No pertinent family history.  Social History:   Social History   Socioeconomic History   Marital status: Unknown    Spouse name: Not on file   Number of children: Not on file   Years of education: Not on file   Highest education level: Not on file  Occupational History   Not on file  Tobacco Use   Smoking status: Never   Smokeless tobacco: Never  Vaping Use   Vaping Use: Never used  Substance and Sexual Activity   Alcohol use: Not on file   Drug use: Not on file   Sexual activity: Not on file  Other Topics Concern   Not on file  Social History Narrative   Not on file   Social Determinants of Health   Financial Resource Strain:  Not on file  Food Insecurity: Not on file  Transportation Needs: Not on file  Physical Activity: Not on file  Stress: Not on file  Social Connections: Not on file      Allergies:  No Known Allergies  Metabolic Disorder Labs: Lab Results  Component Value Date   HGBA1C 4.8 01/25/2021   No results found for: "PROLACTIN" Lab Results  Component Value Date   CHOL 229 (H) 02/18/2022   TRIG 568 (HH) 02/18/2022   HDL 40 02/18/2022   CHOLHDL 5.7 (H) 02/18/2022   LDLCALC 94  02/18/2022   LDLCALC 129 (H) 01/25/2021   Lab Results  Component Value Date   TSH 2.830 01/25/2021    Therapeutic Level Labs: No results found for: "LITHIUM" No results found for: "CBMZ" No results found for: "VALPROATE"  Current Medications: Current Outpatient Medications  Medication Sig Dispense Refill   buPROPion (WELLBUTRIN SR) 150 MG 12 hr tablet Take 1 tablet (150 mg total) by mouth daily. 30 tablet 1   finasteride (PROPECIA) 1 MG tablet TAKE 1 TABLET(1 MG) BY MOUTH DAILY Strength: 1 mg 90 tablet 0   hydrOXYzine (ATARAX) 10 MG tablet Take 1 tablet (10 mg total) by mouth daily as needed. 30 tablet 0   methocarbamol (ROBAXIN) 500 MG tablet Take 1 tablet (500 mg total) by mouth 2 (two) times daily. 20 tablet 0   Multiple Vitamin (MULTI-VITAMIN) tablet      sertraline (ZOLOFT) 100 MG tablet Take 1 tablet (100 mg total) by mouth daily. 30 tablet 1   No current facility-administered medications for this visit.     Psychiatric Specialty Exam: Review of Systems  Respiratory:  Negative for apnea.   Neurological:  Negative for tremors.  Psychiatric/Behavioral:  Positive for dysphoric mood. Negative for agitation and self-injury.     There were no vitals taken for this visit.There is no height or weight on file to calculate BMI.  General Appearance:casual  Eye Contact:  fair  Speech:  Normal Rate  Volume:  Decreased  Mood:  somewhat subdued  Affect:  fair  Thought Process:  Goal Directed  Orientation:  Full (Time, Place, and Person)  Thought Content:  Rumination  Suicidal Thoughts:  No  Homicidal Thoughts:  No  Memory:  Immediate;   Fair Recent;   Fair  Judgement:  Fair  Insight:  Fair  Psychomotor Activity:  Normal  Concentration:  Concentration: Fair and Attention Span: Fair  Recall:  [employer] of Knowledge:Good  Language: Good  Akathisia:  No  Handed:    AIMS (if indicated):  not done  Assets:  Desire for Improvement  ADL's:  Intact  Cognition: WNL  Sleep:   Fair   Screenings: PHQ2-9    Flowsheet Row Counselor from 08/19/2022 in [facility] AT [place] Office Visit from 02/18/2022 in [facility] Office Visit from 09/28/2021 in [facility] Office Visit from 01/25/2021 in [facility] Video Visit from 01/16/2021 in [facility] AT [place]  PHQ-2 Total Score 2 6 1 4 1   PHQ-9 Total Score 8 20 10 18  --      Flowsheet Row Counselor from 08/19/2022 in [facility] AT [place] Office Visit from 06/25/2022 in [facility] AT [place] Video Visit from 05/13/2022 in [facility] AT [place]  C-SSRS RISK CATEGORY Error: Q3, 4, or 5 should not be populated when Q2 is No No Risk No Risk  Assessment and Plan: as follows  Prior documentation reviewed    MDD recurrent moderate subdued, increase wellbutrin to 150mg , continue zoloft and therapy     Generalized anxiety disorder; stressors with legal issue and regular stressors, continue zoloft and therapy    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 Increase wellbutrin, not suicidal continue cpap or bipap compliance 0m, MD 12/13/20233:09 PM

## 2022-12-08 ENCOUNTER — Other Ambulatory Visit: Payer: Self-pay | Admitting: Student

## 2022-12-09 ENCOUNTER — Telehealth (INDEPENDENT_AMBULATORY_CARE_PROVIDER_SITE_OTHER): Payer: BLUE CROSS/BLUE SHIELD | Admitting: Psychiatry

## 2022-12-09 ENCOUNTER — Encounter (HOSPITAL_COMMUNITY): Payer: Self-pay | Admitting: Psychiatry

## 2022-12-09 DIAGNOSIS — Z789 Other specified health status: Secondary | ICD-10-CM | POA: Diagnosis not present

## 2022-12-09 DIAGNOSIS — Z658 Other specified problems related to psychosocial circumstances: Secondary | ICD-10-CM

## 2022-12-09 DIAGNOSIS — F331 Major depressive disorder, recurrent, moderate: Secondary | ICD-10-CM | POA: Diagnosis not present

## 2022-12-09 DIAGNOSIS — F411 Generalized anxiety disorder: Secondary | ICD-10-CM

## 2022-12-09 MED ORDER — BUPROPION HCL ER (SR) 150 MG PO TB12: 150.0000 mg | ORAL_TABLET | Freq: Every day | ORAL | 1 refills | Status: DC

## 2022-12-09 MED ORDER — FINASTERIDE 1 MG PO TABS: ORAL_TABLET | ORAL | 0 refills | Status: DC

## 2022-12-09 MED ORDER — SERTRALINE HCL 100 MG PO TABS: 100.0000 mg | ORAL_TABLET | Freq: Every day | ORAL | 1 refills | Status: DC

## 2022-12-09 NOTE — Progress Notes (Signed)
[facility] Follow up visit  Patient Identification: Theodore [name] MRN:  [id number] Date of Evaluation:  12/09/2022 Referral Source: [facility] Chief Complaint: follow up depression Visit Diagnosis:    ICD-10-CM   1. MDD (major depressive disorder), recurrent episode, moderate (HCC)  F33.1 sertraline (ZOLOFT) 100 MG tablet    2. GAD (generalized anxiety disorder)  F41.1     3. Psychosocial distress  Z65.8     4. Alcohol use  Z78.9         Virtual Visit via Video Note  I connected with Theodore [name] on 12/09/22 at 1:45 PM by a video enabled telemedicine application and verified that I am speaking with the correct person using two identifiers.  Location: Patient: home Provider: home office   I discussed the limitations of evaluation and management by telemedicine and the availability of in person appointments. The patient expressed understanding and agreed to proceed.      I discussed the assessment and treatment plan with the patient. The patient was provided an opportunity to ask questions and all were answered. The patient agreed with the plan and demonstrated an understanding of the instructions.   The patient was advised to call back or seek an in-person evaluation if the symptoms worsen or if the condition fails to improve as anticipated.  I provided 15 minutes of non-face-to-face time during this encounter.     History of Present Illness: Patient is a 52 years old currently single Caucasian male initially referred by last psychiatrist or [facility] Dr Theodore [name] as he has private insurance now and could not [facility] at [facility].  He has been diagnosed with depression and anxiety   Doing better depression better, feels more positive not dwelling on legal case for now Dad is sick but better as well Not taking vistaril it made him more tired College going well Cpap working Increasing wellbutrin has helped   Aggravating factor: difficult childhood, moms death 5 years ago, dad sick,  legal cases  Modifying factor: music, dad Severity subdued  Past Psychiatric History: depression, anxiety  Previous Psychotropic Medications: No   Substance Abuse History in the last 12 months:  Yes.    Consequences of Substance Abuse: have cut down alcohol, understands its effect on mood, memory, depression  Past Medical History:  Past Medical History:  Diagnosis Date   Sleep apnea     History reviewed. No pertinent surgical history.  Family Psychiatric History: mom : depression, dementia  Family History: History reviewed. No pertinent family history.  Social History:   Social History   Socioeconomic History   Marital status: Unknown    Spouse name: Not on file   Number of children: Not on file   Years of education: Not on file   Highest education level: Not on file  Occupational History   Not on file  Tobacco Use   Smoking status: Never   Smokeless tobacco: Never  Vaping Use   Vaping Use: Never used  Substance and Sexual Activity   Alcohol use: Not on file   Drug use: Not on file   Sexual activity: Not on file  Other Topics Concern   Not on file  Social History Narrative   Not on file   Social Determinants of Health   Financial Resource Strain: Not on file  Food Insecurity: Not on file  Transportation Needs: Not on file  Physical Activity: Not on file  Stress: Not on file  Social Connections: Not on file      Allergies:  No Known Allergies  Metabolic Disorder Labs: Lab Results  Component Value Date   HGBA1C 4.8 01/25/2021   No results found for: "PROLACTIN" Lab Results  Component Value Date   CHOL 229 (H) 02/18/2022   TRIG 568 (HH) 02/18/2022   HDL 40 02/18/2022   CHOLHDL 5.7 (H) 02/18/2022   LDLCALC 94 02/18/2022   LDLCALC 129 (H) 01/25/2021   Lab Results  Component Value Date   TSH 2.830 01/25/2021    Therapeutic Level Labs: No results found for: "LITHIUM" No results found for: "CBMZ" No results found for:  "VALPROATE"  Current Medications: Current Outpatient Medications  Medication Sig Dispense Refill   buPROPion (WELLBUTRIN SR) 150 MG 12 hr tablet Take 1 tablet (150 mg total) by mouth daily. 30 tablet 1   finasteride (PROPECIA) 1 MG tablet TAKE 1 TABLET(1 MG) BY MOUTH DAILY Strength: 1 mg 90 tablet 0   methocarbamol (ROBAXIN) 500 MG tablet Take 1 tablet (500 mg total) by mouth 2 (two) times daily. 20 tablet 0   Multiple Vitamin (MULTI-VITAMIN) tablet      sertraline (ZOLOFT) 100 MG tablet Take 1 tablet (100 mg total) by mouth daily. 30 tablet 1   No current facility-administered medications for this visit.     Psychiatric Specialty Exam: Review of Systems  Respiratory:  Negative for apnea.   Neurological:  Negative for tremors.  Psychiatric/Behavioral:  Negative for agitation and self-injury.     There were no vitals taken for this visit.There is no height or weight on file to calculate BMI.  General Appearance:casual  Eye Contact:  fair  Speech:  Normal Rate  Volume:  Decreased  Mood: better  Affect:  fair  Thought Process:  Goal Directed  Orientation:  Full (Time, Place, and Person)  Thought Content:  Rumination  Suicidal Thoughts:  No  Homicidal Thoughts:  No  Memory:  Immediate;   Fair Recent;   Fair  Judgement:  Fair  Insight:  Fair  Psychomotor Activity:  Normal  Concentration:  Concentration: Fair and Attention Span: Fair  Recall:  [employer] of Knowledge:Good  Language: Good  Akathisia:  No  Handed:    AIMS (if indicated):  not done  Assets:  Desire for Improvement  ADL's:  Intact  Cognition: WNL  Sleep:  Fair   Screenings: Web designer from 08/19/2022 in [place] at [place] Visit from 02/18/2022 in [place] Office Visit from 09/28/2021 in [place] Office Visit from 01/25/2021 in [place] Video Visit from  01/16/2021 in [place] at [facility]  PHQ-2 Total Score 2 6 1 4 1   PHQ-9 Total Score 8 20 10 18  --      Flowsheet Row Counselor from 08/19/2022 in [place] at [place] Visit from 06/25/2022 in [place] at [facility] Video Visit from 05/13/2022 in [place] at [place] Error: Q3, 4, or 5 should not be populated when Q2 is No No Risk No Risk       Assessment and Plan: as follows   Prior documentation reviewed  MDD recurrent moderate :  better continue wellbutrin 150mg     Generalized anxiety disorder; better continue zoloft    Alcohol use: has taken less amount, understand the risk ,discussed relapse prevention Fu 25m. Refills sent Merian Capron, MD  1/22/20241:52 PM

## 2022-12-11 NOTE — Progress Notes (Signed)
HPI M never smoker for sleep evaluation for concern of OSA complicated by  Depression, Anxiety, Hyperlipidemia,  Unmarried, lives with father. NPSG 08/27/21- AHI 76.2/ hr, desaturation to 77%, body weight 190 lbs, titration required BIPAP 25/21.  ===================================================================  03/07/22- 3 yoM never smoker for sleep evaluation for concern of OSA courtesy of Orvis Brill, DO. Medical problem list includes Depression, Anxiety, Hyperlipidemia,  Unmarried, lives with father. NPSG 08/27/21- AHI 76.2/ hr, desaturation to 77%, body weight 190 lbs, titration required BIPAP 25/21.  BIPAP 25/21/ [facility]    new 01/30/22     AirCurve 10 VAuto Download compliance 50%, AHI 5.3/ hr     mostly short nights Epworth score-14 Covid- 2 Moderna Flu- no Body weight today-199 lbs. -----Patient currently wears BIPAP and uses [facility]. States that he thinks the pressure is too high and makes his ear drums hurt Parents had OSA. No hx ENT surgery. Denies heart or lung disease. Rarely uses melatonin or trazodone for sleep. Little caffeine. Aware of snoring and daytime sleepiness. Bedtime 10-11PM, up 7AM. Has lost 26 lbs in las 2 years and trying to lose more.   12/12/22- 51 yoM never smoker for sleep evaluation for concern of OSA complicated by  Depression, Anxiety, Hyperlipidemia,  Unmarried, lives with father. NPSG 08/27/21- AHI 76.2/ hr, desaturation to 77%, body weight 190 lbs, titration required BIPAP 25/21.  BIPAP 20/16[facility]    new 01/30/22     AirCurve 10 VAuto>> 15/12 Download compliance 7%, AHI 8.6/hr Covid- 2 Moderna Flu- standard today Body weight today-199 lbs -----States bipap pressure is too much feels like he is smothering. Also states machine is causing dry mouth Discussed insurance back.  Still says BiPAP pressures are too high and over drying.  We discussed comfort and goals.  We are going to try reducing by prep pressure to 15/12 and have discussed adjusting  the humidifier.  If no better then we may try auto BiPAP and then if necessary alternative treatments.   ROS-see HPI  + = positive Constitutional:    weight loss, night sweats, fevers, chills, fatigue, lassitude. HEENT:    +headaches, difficulty swallowing, tooth/dental problems, sore throat,       sneezing, itching,+ ear ache, nasal congestion, post nasal drip, snoring CV:    chest pain, orthopnea, PND, swelling in lower extremities, anasarca,                                   dizziness, palpitations Resp:   shortness of breath with exertion or at rest.                productive cough,   non-productive cough, coughing up of blood.              change in color of mucus.  wheezing.   Skin:    rash or lesions. GI:  No-   heartburn, indigestion, abdominal pain, nausea, vomiting, diarrhea,                 change in bowel habits, loss of appetite GU: dysuria, change in color of urine, no urgency or frequency.   flank pain. MS:   joint pain,+ stiffness, decreased range of motion, back pain. Neuro-     nothing unusual Psych:  change in mood or affect. + depression or +anxiety.   memory loss.  OBJ- Physical Exa+ General- Alert, Oriented, Affect-appropriate, Distress- none acute, +overweight Skin- rash-none, lesions- none, excoriation- none  Lymphadenopathy- none Head- atraumatic            Eyes- Gross vision intact, PERRLA, conjunctivae and secretions clear            Ears- Hearing, canals-normal            Nose- Clear, no-Septal dev, mucus, polyps, erosion, perforation             Throat- Mallampati III , mucosa clear , drainage- none, tonsils- atrophic, + teeth Neck- flexible , trachea midline, no stridor , thyroid nl, carotid no bruit Chest - symmetrical excursion , unlabored           Heart/CV- RRR , no murmur , no gallop  , no rub, nl s1 s2                           - JVD- none , edema- none, stasis changes- none, varices- none           Lung- clear to P&A, wheeze- none, cough- none ,  dullness-none, rub- none           Chest wall-  Abd-  Br/ Gen/ Rectal- Not done, not indicated Extrem- cyanosis- none, clubbing, none, atrophy- none, strength- nl Neuro- grossly intact to observation

## 2022-12-12 ENCOUNTER — Encounter: Payer: Self-pay | Admitting: Internal Medicine

## 2022-12-12 ENCOUNTER — Ambulatory Visit (INDEPENDENT_AMBULATORY_CARE_PROVIDER_SITE_OTHER): Payer: BLUE CROSS/BLUE SHIELD | Admitting: Internal Medicine

## 2022-12-12 VITALS — BP 118/72 | HR 86 | Ht 68.0 in | Wt 199.2 lb

## 2022-12-12 DIAGNOSIS — G4733 Obstructive sleep apnea (adult) (pediatric): Secondary | ICD-10-CM | POA: Diagnosis not present

## 2022-12-12 DIAGNOSIS — F419 Anxiety disorder, unspecified: Secondary | ICD-10-CM

## 2022-12-12 NOTE — Patient Instructions (Signed)
Order DME [facility]   please change BIPAP range to 15/ 12  Please call if we can help

## 2022-12-20 ENCOUNTER — Ambulatory Visit (INDEPENDENT_AMBULATORY_CARE_PROVIDER_SITE_OTHER): Payer: BLUE CROSS/BLUE SHIELD | Admitting: Student

## 2022-12-20 ENCOUNTER — Encounter: Payer: Self-pay | Admitting: Student

## 2022-12-20 VITALS — BP 119/64 | HR 79 | Ht 68.0 in | Wt 199.4 lb

## 2022-12-20 DIAGNOSIS — R5383 Other fatigue: Secondary | ICD-10-CM | POA: Diagnosis not present

## 2022-12-20 DIAGNOSIS — E785 Hyperlipidemia, unspecified: Secondary | ICD-10-CM

## 2022-12-20 DIAGNOSIS — Z130 Encounter for screening for diseases of the blood and blood-forming organs and certain disorders involving the immune mechanism: Secondary | ICD-10-CM

## 2022-12-20 DIAGNOSIS — Z13220 Encounter for screening for lipoid disorders: Secondary | ICD-10-CM

## 2022-12-20 MED ORDER — FINASTERIDE 1 MG PO TABS: ORAL_TABLET | ORAL | 3 refills | Status: AC

## 2022-12-20 NOTE — Progress Notes (Unsigned)
    SUBJECTIVE:   CHIEF COMPLAINT / HPI:   Doesn't feel like his usual self Unsure if it is psychological stress or not Has been going on for  Taking care of Dad full-time. Has CHF and kidney tubes have collapsed.   Really bad for past 3 years, worsening over past few months. Not feeling motivated.  Off and on muscle pains. No weight changes.  No blood in stool. Eating well.  PERTINENT  PMH / PSH: ***  OBJECTIVE:   BP 119/64   Pulse 79   Ht 5\' 8"  (1.727 m)   Wt 199 lb 6.4 oz (90.4 kg)   SpO2 100%   BMI 30.32 kg/m  ***   ASSESSMENT/PLAN:   No problem-specific Assessment & Plan notes found for this encounter.     Orvis Brill, [place]    {    This will disappear when note is signed, click to select method of visit    :1}

## 2022-12-20 NOTE — Patient Instructions (Signed)
It was great seeing you today.  I refilled your Finasteride.  We collected testing today- I will call you if it is abnormal. If your results are normal, I will send you a MyChart message.   If you have any questions or concerns, please feel free to call the clinic.   Have a wonderful day,  Dr. Orvis Brill [facility] [phone]

## 2022-12-21 LAB — CBC
Hematocrit: 43 % (ref 37.5–51.0)
Hemoglobin: 14.9 g/dL (ref 13.0–17.7)
MCH: 32.9 pg (ref 26.6–33.0)
MCHC: 34.7 g/dL (ref 31.5–35.7)
MCV: 95 fL (ref 79–97)
Platelets: 206 10*3/uL (ref 150–450)
RBC: 4.53 x10E6/uL (ref 4.14–5.80)
RDW: 11.7 % (ref 11.6–15.4)
WBC: 6.4 10*3/uL (ref 3.4–10.8)

## 2022-12-21 LAB — COMPREHENSIVE METABOLIC PANEL
ALT: 50 IU/L — ABNORMAL HIGH (ref 0–44)
AST: 68 IU/L — ABNORMAL HIGH (ref 0–40)
Albumin/Globulin Ratio: 1.8 (ref 1.2–2.2)
Albumin: 4.4 g/dL (ref 4.1–5.1)
Alkaline Phosphatase: 63 IU/L (ref 44–121)
BUN/Creatinine Ratio: 18 (ref 9–20)
BUN: 18 mg/dL (ref 6–24)
Bilirubin Total: 0.2 mg/dL (ref 0.0–1.2)
CO2: 25 mmol/L (ref 20–29)
Calcium: 9.8 mg/dL (ref 8.7–10.2)
Chloride: 101 mmol/L (ref 96–106)
Creatinine, Ser: 1 mg/dL (ref 0.76–1.27)
Globulin, Total: 2.4 g/dL (ref 1.5–4.5)
Glucose: 115 mg/dL — ABNORMAL HIGH (ref 70–99)
Potassium: 4.6 mmol/L (ref 3.5–5.2)
Sodium: 140 mmol/L (ref 134–144)
Total Protein: 6.8 g/dL (ref 6.0–8.5)
eGFR: 92 mL/min/{1.73_m2} (ref >59)

## 2022-12-21 LAB — LIPID PANEL
Chol/HDL Ratio: 4.8 ratio (ref 0.0–5.0)
Cholesterol, Total: 237 mg/dL — ABNORMAL HIGH (ref 100–199)
HDL: 49 mg/dL (ref >39)
LDL Chol Calc (NIH): 149 mg/dL — ABNORMAL HIGH (ref 0–99)
Triglycerides: 216 mg/dL — ABNORMAL HIGH (ref 0–149)
VLDL Cholesterol Cal: 39 mg/dL (ref 5–40)

## 2022-12-21 LAB — VITAMIN D 25 HYDROXY (VIT D DEFICIENCY, FRACTURES): Vit D, 25-Hydroxy: 25.4 ng/mL — ABNORMAL LOW (ref 30.0–100.0)

## 2022-12-21 LAB — TSH RFX ON ABNORMAL TO FREE T4: TSH: 2.77 u[IU]/mL (ref 0.450–4.500)

## 2022-12-21 LAB — TESTOSTERONE: Testosterone: 254 ng/dL — ABNORMAL LOW (ref 264–916)

## 2022-12-24 NOTE — Assessment & Plan Note (Signed)
Ongoing for a few years. Patient is doing everything we can to manage this including seeing a psychiatrist and taking his SSRI, wearing CPAP and following up with pulmonology. Will check basic labs today including CBC, vitamin D, testosterone (per patient request), TSH. Discussed with patient causing fatigue is likely multifactorial.  No red flag signs or symptoms

## 2022-12-24 NOTE — Assessment & Plan Note (Signed)
Lipid panel today

## 2022-12-25 ENCOUNTER — Encounter: Payer: Self-pay | Admitting: Student

## 2022-12-30 ENCOUNTER — Other Ambulatory Visit (HOSPITAL_COMMUNITY): Payer: Self-pay

## 2023-01-02 ENCOUNTER — Telehealth: Payer: Self-pay

## 2023-01-02 NOTE — Telephone Encounter (Signed)
Patient LVM on nurse line requesting to speak with PCP about recent results.   Will forward to PCP.

## 2023-01-07 ENCOUNTER — Telehealth: Payer: Self-pay | Admitting: Student

## 2023-01-07 NOTE — Telephone Encounter (Signed)
Called patient and discussed lab findings (low Vitamin D, elevated LDL) and ASCD risk score:  The 10-year ASCVD risk score (Arnett DK, et al., 2019) is: 6.9%   Values used to calculate the score:     Age: 52 years     Sex: Male     Is Non-Hispanic African American: No     Diabetic: Yes     Tobacco smoker: No     Systolic Blood Pressure: 123456 mmHg     Is BP treated: No     HDL Cholesterol: 49 mg/dL     Total Cholesterol: 237 mg/dL  Patient will start Vitamin D supplementation OTC. Will defer statin for now and work on lifestyle changes.

## 2023-01-10 ENCOUNTER — Encounter: Payer: Self-pay | Admitting: Internal Medicine

## 2023-01-10 NOTE — Assessment & Plan Note (Signed)
Discussed compliance and comfort with BiPAP.  I think he may fret too much about small details and we discussed comfort goals.

## 2023-01-10 NOTE — Assessment & Plan Note (Signed)
We need to continue making adjustments to try to get him comfortable enough that he can wear CPAP/BiPAP he is fully. Plan-reduce BiPAP pressure to 15/12.  Instruct adjustment of humidifier for dryness.

## 2023-03-05 ENCOUNTER — Encounter: Payer: Self-pay | Admitting: Student

## 2023-03-05 ENCOUNTER — Other Ambulatory Visit (HOSPITAL_COMMUNITY): Payer: Self-pay

## 2023-03-07 ENCOUNTER — Other Ambulatory Visit (HOSPITAL_COMMUNITY): Payer: Self-pay

## 2023-03-10 ENCOUNTER — Telehealth (INDEPENDENT_AMBULATORY_CARE_PROVIDER_SITE_OTHER): Payer: BLUE CROSS/BLUE SHIELD | Admitting: Psychiatry

## 2023-03-10 ENCOUNTER — Encounter (HOSPITAL_COMMUNITY): Payer: Self-pay | Admitting: Psychiatry

## 2023-03-10 DIAGNOSIS — F411 Generalized anxiety disorder: Secondary | ICD-10-CM | POA: Diagnosis not present

## 2023-03-10 DIAGNOSIS — Z789 Other specified health status: Secondary | ICD-10-CM | POA: Diagnosis not present

## 2023-03-10 DIAGNOSIS — F331 Major depressive disorder, recurrent, moderate: Secondary | ICD-10-CM

## 2023-03-10 MED ORDER — SERTRALINE HCL 100 MG PO TABS: 100.0000 mg | ORAL_TABLET | Freq: Every day | ORAL | 1 refills | Status: AC

## 2023-03-10 MED ORDER — BUPROPION HCL ER (SR) 150 MG PO TB12: 150.0000 mg | ORAL_TABLET | Freq: Every day | ORAL | 2 refills | Status: AC

## 2023-03-10 NOTE — Progress Notes (Signed)
[facility] Follow up visit  Patient Identification: Theodore [name] MRN:  [id number] Date of Evaluation:  03/10/2023 Referral Source: [facility] Chief Complaint: follow up depression Visit Diagnosis:    ICD-10-CM   1. MDD (major depressive disorder), recurrent episode, moderate  F33.1 sertraline (ZOLOFT) 100 MG tablet    2. GAD (generalized anxiety disorder)  F41.1     3. Alcohol use  Z78.9      Virtual Visit via Video Note  I connected with Theodore [name] on 03/10/23 at  2:00 PM EDT by a video enabled telemedicine application and verified that I am speaking with the correct person using two identifiers.  Location: Patient: parked car Provider: home office   I discussed the limitations of evaluation and management by telemedicine and the availability of in person appointments. The patient expressed understanding and agreed to proceed.     I discussed the assessment and treatment plan with the patient. The patient was provided an opportunity to ask questions and all were answered. The patient agreed with the plan and demonstrated an understanding of the instructions.   The patient was advised to call back or seek an in-person evaluation if the symptoms worsen or if the condition fails to improve as anticipated.  I provided 15 - 20  minutes of non-face-to-face time during this encounter.        History of Present Illness: Patient is a 52 years old currently single Caucasian male initially referred by last psychiatrist or [facility] Dr Nila Nephew as he has private insurance now and could not be seen at [facility].  He has been diagnosed with depression and anxiety    On eval doing better, graduating this May,  Does aRts and music  Cpap working Increasing wellbutrin has helped   Aggravating factor: difficult childhood, moms death 5 years ago, dad sick, legal cases  Modifying factor: music, dad Severity better  Past Psychiatric History: depression, anxiety  Previous Psychotropic  Medications: No   Substance Abuse History in the last 12 months:  Yes.    Consequences of Substance Abuse: have cut down alcohol, understands its effect on mood, memory, depression  Past Medical History:  Past Medical History:  Diagnosis Date  . Sleep apnea     No past surgical history on file.  Family Psychiatric History: mom : depression, dementia  Family History: No family history on file.  Social History:   Social History   Socioeconomic History  . Marital status: Unknown    Spouse name: Not on file  . Number of children: Not on file  . Years of education: Not on file  . Highest education level: Not on file  Occupational History  . Not on file  Tobacco Use  . Smoking status: Never  . Smokeless tobacco: Never  Vaping Use  . Vaping Use: Never used  Substance and Sexual Activity  . Alcohol use: Not on file  . Drug use: Not on file  . Sexual activity: Not on file  Other Topics Concern  . Not on file  Social History Narrative  . Not on file   Social Determinants of Health   Financial Resource Strain: Not on file  Food Insecurity: Not on file  Transportation Needs: Not on file  Physical Activity: Not on file  Stress: Not on file  Social Connections: Not on file      Allergies:  No Known Allergies  Metabolic Disorder Labs: Lab Results  Component Value Date   HGBA1C 4.8 01/25/2021   No results found  for: "PROLACTIN" Lab Results  Component Value Date   CHOL 237 (H) 12/20/2022   TRIG 216 (H) 12/20/2022   HDL 49 12/20/2022   CHOLHDL 4.8 12/20/2022   LDLCALC 149 (H) 12/20/2022   LDLCALC 94 02/18/2022   Lab Results  Component Value Date   TSH 2.770 12/20/2022    Therapeutic Level Labs: No results found for: "LITHIUM" No results found for: "CBMZ" No results found for: "VALPROATE"  Current Medications: Current Outpatient Medications  Medication Sig Dispense Refill  . buPROPion (WELLBUTRIN SR) 150 MG 12 hr tablet Take 1 tablet (150 mg total) by  mouth daily. 30 tablet 2  . finasteride (PROPECIA) 1 MG tablet TAKE 1 TABLET(1 MG) BY MOUTH DAILY 90 tablet 3  . melatonin 1 MG TABS tablet Take 3 mg by mouth at bedtime.    . Multiple Vitamin (MULTI-VITAMIN) tablet     . sertraline (ZOLOFT) 100 MG tablet Take 1 tablet (100 mg total) by mouth daily. 30 tablet 1   No current facility-administered medications for this visit.     Psychiatric Specialty Exam: Review of Systems  Respiratory:  Negative for apnea.   Neurological:  Negative for tremors.  Psychiatric/Behavioral:  Negative for agitation, dysphoric mood and self-injury.     There were no vitals taken for this visit.There is no height or weight on file to calculate BMI.  General Appearance:casual  Eye Contact:  fair  Speech:  Normal Rate  Volume:  Decreased  Mood: improved  Affect:  fair  Thought Process:  Goal Directed  Orientation:  Full (Time, Place, and Person)  Thought Content:  Rumination  Suicidal Thoughts:  No  Homicidal Thoughts:  No  Memory:  Immediate;   Fair Recent;   Fair  Judgement:  Fair  Insight:  Fair  Psychomotor Activity:  Normal  Concentration:  Concentration: Fair and Attention Span: Fair  Recall:  [employer] of Knowledge:Good  Language: Good  Akathisia:  No  Handed:    AIMS (if indicated):  not done  Assets:  Desire for Improvement  ADL's:  Intact  Cognition: WNL  Sleep:  Fair   Screenings: PHQ2-9    Flowsheet Row Office Visit from 12/20/2022 in [facility] Counselor from 08/19/2022 in [facility] Office Visit from 02/18/2022 in [facility] Office Visit from 09/28/2021 in [facility] Office Visit from 01/25/2021 in [facility]  PHQ-2 Total Score PHQ-9 Total Score -- Flowsheet Row Counselor from 08/19/2022 in [facility] at [facility]  Office Visit from 06/25/2022 in [facility] at [facility] Video Visit from 05/13/2022 in [facility] at [facility]  C-SSRS RISK CATEGORY Error: Q3, 4, or 5 should not be populated when Q2 is No No Risk No Risk       Assessment and Plan: as follows  Prior documentation reviewed   MDD recurrent moderate : improved continue wellbutrin    Generalized anxiety disorder; fair continue zoloft     Alcohol use: has cut down, understands the risk, discussed sobreity  Fu 3 m. Renewed meds if due   Thresa Ross, MD 4/22/20242:08 PM

## 2023-03-12 NOTE — Progress Notes (Deleted)
HPI Theodore [name] never smoker for sleep evaluation for concern of OSA complicated by  Depression, Anxiety, Hyperlipidemia,  Unmarried, lives with father. NPSG 08/27/21- AHI 76.2/ hr, desaturation to 77%, body weight 190 lbs, titration required BIPAP 25/21.  ===================================================================   12/12/22- 51 yoM never smoker for sleep evaluation for concern of OSA complicated by  Depression, Anxiety, Hyperlipidemia,  Unmarried, lives with father. NPSG 08/27/21- AHI 76.2/ hr, desaturation to 77%, body weight 190 lbs, titration required BIPAP 25/21.  BIPAP 20/16[employer]    new 01/30/22     AirCurve 10 VAuto>> 15/12 Download compliance 7%, AHI 8.6/hr Covid- 2 Moderna Flu- standard today Body weight today-199 lbs -----States bipap pressure is too much feels like he is smothering. Also states machine is causing dry mouth Discussed insurance back.  Still says BiPAP pressures are too high and over drying.  We discussed comfort and goals.  We are going to try reducing by prep pressure to 15/12 and have discussed adjusting the humidifier.  If no better then we may try auto BiPAP and then if necessary alternative treatments.  03/13/23- 52 yoM never smoker for sleep evaluation for concern of OSA complicated by  Depression, Anxiety, Hyperlipidemia,  Unmarried, lives with father. BIPAP 15/12 [employer]    new 01/30/22     AirCurve 10 VAuto Download compliance  Body weight today-   ROS-see HPI  + = positive Constitutional:    weight loss, night sweats, fevers, chills, fatigue, lassitude. HEENT:    +headaches, difficulty swallowing, tooth/dental problems, sore throat,       sneezing, itching,+ ear ache, nasal congestion, post nasal drip, snoring CV:    chest pain, orthopnea, PND, swelling in lower extremities, anasarca,                                   dizziness, palpitations Resp:   shortness of breath with exertion or at rest.                productive cough,   non-productive cough,  coughing up of blood.              change in color of mucus.  wheezing.   Skin:    rash or lesions. GI:  No-   heartburn, indigestion, abdominal pain, nausea, vomiting, diarrhea,                 change in bowel habits, loss of appetite GU: dysuria, change in color of urine, no urgency or frequency.   flank pain. MS:   joint pain,+ stiffness, decreased range of motion, back pain. Neuro-     nothing unusual Psych:  change in mood or affect. + depression or +anxiety.   memory loss.  OBJ- Physical Exa+ General- Alert, Oriented, Affect-appropriate, Distress- none acute, +overweight Skin- rash-none, lesions- none, excoriation- none Lymphadenopathy- none Head- atraumatic            Eyes- Gross vision intact, PERRLA, conjunctivae and secretions clear            Ears- Hearing, canals-normal            Nose- Clear, no-Septal dev, mucus, polyps, erosion, perforation             Throat- Mallampati III , mucosa clear , drainage- none, tonsils- atrophic, + teeth Neck- flexible , trachea midline, no stridor , thyroid nl, carotid no bruit Chest - symmetrical excursion , unlabored  Heart/CV- RRR , no murmur , no gallop  , no rub, nl s1 s2                           - JVD- none , edema- none, stasis changes- none, varices- none           Lung- clear to P&A, wheeze- none, cough- none , dullness-none, rub- none           Chest wall-  Abd-  Br/ Gen/ Rectal- Not done, not indicated Extrem- cyanosis- none, clubbing, none, atrophy- none, strength- nl Neuro- grossly intact to observation

## 2023-03-13 ENCOUNTER — Ambulatory Visit: Payer: BLUE CROSS/BLUE SHIELD | Admitting: Internal Medicine

## 2023-03-17 ENCOUNTER — Other Ambulatory Visit (HOSPITAL_COMMUNITY): Payer: Self-pay

## 2023-03-18 ENCOUNTER — Telehealth: Payer: Self-pay

## 2023-03-18 NOTE — Telephone Encounter (Signed)
A Prior Authorization was initiated for this patients FINASTERIDE through CoverMyMeds.   Key: [id number]

## 2023-03-18 NOTE — Telephone Encounter (Signed)
Prior Auth for patients medication FINASTERIDE denied by [employer] via CoverMyMeds.   Reason:  This health benefit plan does not cover the following services, supplies, drugs or charges: Cosmetic services, which include the removal of excess skin from the abdomen, arms or thighs, skin tag excisions, cryotherapy or chemical exfoliation for active acne and acne scarring, superficial dermabrasion, injection of dermal fillers, services for hair transplants, skin tone enhancements, electrolysis, and surgery for psychological or emotional reasons, except as specifically covered by this health benefit plan.   CoverMyMeds Key: [id number]

## 2023-03-31 ENCOUNTER — Ambulatory Visit (INDEPENDENT_AMBULATORY_CARE_PROVIDER_SITE_OTHER): Payer: BLUE CROSS/BLUE SHIELD | Admitting: Student

## 2023-03-31 ENCOUNTER — Encounter: Payer: Self-pay | Admitting: Student

## 2023-03-31 VITALS — BP 123/77 | HR 64 | Ht 68.0 in | Wt 194.6 lb

## 2023-03-31 DIAGNOSIS — R4189 Other symptoms and signs involving cognitive functions and awareness: Secondary | ICD-10-CM

## 2023-03-31 DIAGNOSIS — R5383 Other fatigue: Secondary | ICD-10-CM

## 2023-03-31 MED ORDER — LIDOCAINE 4 % EX PTCH: 1.0000 | MEDICATED_PATCH | CUTANEOUS | 0 refills | Status: AC

## 2023-03-31 NOTE — Progress Notes (Signed)
    SUBJECTIVE:   CHIEF COMPLAINT / HPI:   Theodore [name] is a 52 year old male here for follow-up of fatigue, brain fog, erectile dysfunction. He is very proud that he has been working out lately and is lost 10 pounds.  Does have some soreness after his exercises.  He says he does stretch and stays hydrated.  Wants to have his testosterone level checked.  We did check a level 3 months ago, and was 254 but but because this result was in the afternoon and was not fasting, it is not reliable predictor of hypogonadism.  He is reporting some sexual performance issues.  He would like to know if low testosterone is contributing to this before starting any new medications.  He graduated from receiving a college degree last Thursday.  Still taking care of his father.  PERTINENT  PMH / PSH: Major depressive disorder, OSA, hyperlipidemia  OBJECTIVE:   BP 123/77   Pulse 64   Ht 5\' 8"  (1.727 m)   Wt 194 lb 9.6 oz (88.3 kg)   SpO2 98%   BMI 29.59 kg/m  General: Alert and cooperative and appears to be in no acute distress Pulm: Clear to auscultation bilaterally, no crackles, wheezing, or diminished breath sounds. Normal respiratory effort Neuro: Cranial nerves grossly intact  ASSESSMENT/PLAN:   Fatigue Will get fasting, morning testosterone.  Patient is to schedule lab visit between 8 and 10 AM. Encouraged to continue exercising. Provided lidocaine patch prescription and discussed other methods to help relieve delayed onset muscle soreness with exercise.     Darral Dash, DO [facility]

## 2023-03-31 NOTE — Assessment & Plan Note (Signed)
Will get fasting, morning testosterone.  Patient is to schedule lab visit between 8 and 10 AM. Encouraged to continue exercising. Provided lidocaine patch prescription and discussed other methods to help relieve delayed onset muscle soreness with exercise.

## 2023-03-31 NOTE — Patient Instructions (Addendum)
It was great seeing you today.  We will check your testosterone level. Please schedule a morning appointment between 8:00- 10:00 AM. Make sure you do not have anything to eat past midnight the day before your lab appointment.  You are doing a great job working out! Drink plenty of fluids. You can use BioFreeze gel or lidocaine patches as needed.   If you have any questions or concerns, please feel free to call the clinic.    Be well,  Dr. Darral Dash [facility] [phone]

## 2023-04-02 ENCOUNTER — Other Ambulatory Visit: Payer: BLUE CROSS/BLUE SHIELD

## 2023-04-02 ENCOUNTER — Other Ambulatory Visit: Payer: Self-pay

## 2023-04-02 DIAGNOSIS — R4189 Other symptoms and signs involving cognitive functions and awareness: Secondary | ICD-10-CM

## 2023-04-03 LAB — TESTOSTERONE: Testosterone: 423 ng/dL (ref 264–916)

## 2023-04-17 ENCOUNTER — Encounter: Payer: Self-pay | Admitting: Student

## 2023-04-17 ENCOUNTER — Other Ambulatory Visit: Payer: Self-pay | Admitting: Student

## 2023-04-17 MED ORDER — TADALAFIL 5 MG PO TABS: 5.0000 mg | ORAL_TABLET | Freq: Every day | ORAL | 0 refills | Status: AC | PRN

## 2023-04-24 ENCOUNTER — Ambulatory Visit: Payer: BLUE CROSS/BLUE SHIELD | Admitting: Internal Medicine

## 2023-05-20 ENCOUNTER — Ambulatory Visit: Payer: BLUE CROSS/BLUE SHIELD | Admitting: Internal Medicine

## 2023-06-09 ENCOUNTER — Telehealth (HOSPITAL_COMMUNITY): Payer: BLUE CROSS/BLUE SHIELD | Admitting: Psychiatry

## 2023-06-09 ENCOUNTER — Telehealth (HOSPITAL_COMMUNITY): Payer: Self-pay | Admitting: Psychiatry

## 2023-06-09 ENCOUNTER — Encounter (HOSPITAL_COMMUNITY): Payer: Self-pay

## 2023-06-09 NOTE — Telephone Encounter (Signed)
At registration/check-in notification received that patient's insurance is out-of-network for this clinic/provider. Ran the estimate for today's visit and called patient to notify/review. No answer - left voicemail informing patient that insurance is out-of-network and estimated charge for today's visit is $240 - $260.

## 2023-06-11 NOTE — Progress Notes (Deleted)
HPI M never smoker for sleep evaluation for concern of OSA complicated by  Depression, Anxiety, Hyperlipidemia,  Unmarried, lives with father. NPSG 08/27/21- AHI 76.2/ hr, desaturation to 77%, body weight 190 lbs, titration required BIPAP 25/21.  ===================================================================  12/12/22- 51 yoM never smoker for sleep evaluation for concern of OSA complicated by  Depression, Anxiety, Hyperlipidemia,  Unmarried, lives with father. NPSG 08/27/21- AHI 76.2/ hr, desaturation to 77%, body weight 190 lbs, titration required BIPAP 25/21.  BIPAP 20/16[employer]    new 01/30/22     AirCurve 10 VAuto>> 15/12 Download compliance 7%, AHI 8.6/hr Covid- 2 Moderna Flu- standard today Body weight today-199 lbs -----States bipap pressure is too much feels like he is smothering. Also states machine is causing dry mouth Discussed insurance back.  Still says BiPAP pressures are too high and over drying.  We discussed comfort and goals.  We are going to try reducing  pressure to 15/12 and have discussed adjusting the humidifier.  If no better then we may try auto BiPAP and then if necessary alternative treatments.  06/12/23-  52 yoM never smoker for sleep evaluation for concern of OSA complicated by  Depression, Anxiety, Hyperlipidemia,  Unmarried, lives with father. NPSG 08/27/21- AHI 76.2/ hr, desaturation to 77%, body weight 190 lbs, titration required BIPAP 25/21.  BIPAP 15/12 [employer]    new 01/30/22     AirCurve 10 VAuto Download compliance  Body weight today-   ROS-see HPI  + = positive Constitutional:    weight loss, night sweats, fevers, chills, fatigue, lassitude. HEENT:    +headaches, difficulty swallowing, tooth/dental problems, sore throat,       sneezing, itching,+ ear ache, nasal congestion, post nasal drip, snoring CV:    chest pain, orthopnea, PND, swelling in lower extremities, anasarca,                                   dizziness, palpitations Resp:    shortness of breath with exertion or at rest.                productive cough,   non-productive cough, coughing up of blood.              change in color of mucus.  wheezing.   Skin:    rash or lesions. GI:  No-   heartburn, indigestion, abdominal pain, nausea, vomiting, diarrhea,                 change in bowel habits, loss of appetite GU: dysuria, change in color of urine, no urgency or frequency.   flank pain. MS:   joint pain,+ stiffness, decreased range of motion, back pain. Neuro-     nothing unusual Psych:  change in mood or affect. + depression or +anxiety.   memory loss.  OBJ- Physical Exa+ General- Alert, Oriented, Affect-appropriate, Distress- none acute, +overweight Skin- rash-none, lesions- none, excoriation- none Lymphadenopathy- none Head- atraumatic            Eyes- Gross vision intact, PERRLA, conjunctivae and secretions clear            Ears- Hearing, canals-normal            Nose- Clear, no-Septal dev, mucus, polyps, erosion, perforation             Throat- Mallampati III , mucosa clear , drainage- none, tonsils- atrophic, + teeth Neck- flexible , trachea midline, no stridor , thyroid  nl, carotid no bruit Chest - symmetrical excursion , unlabored           Heart/CV- RRR , no murmur , no gallop  , no rub, nl s1 s2                           - JVD- none , edema- none, stasis changes- none, varices- none           Lung- clear to P&A, wheeze- none, cough- none , dullness-none, rub- none           Chest wall-  Abd-  Br/ Gen/ Rectal- Not done, not indicated Extrem- cyanosis- none, clubbing, none, atrophy- none, strength- nl Neuro- grossly intact to observation

## 2023-06-12 ENCOUNTER — Ambulatory Visit: Payer: BLUE CROSS/BLUE SHIELD | Admitting: Internal Medicine

## 2023-09-26 IMAGING — CT CT HEAD W/O CM
4 series · 17 of 47 positions shown, 19 images · non-contrast
Comparison: None.

CLINICAL DATA: Left sided weakness

EXAM:
CT HEAD WITHOUT CONTRAST
TECHNIQUE: Contiguous axial images were obtained from the base of the skull
through the vertex without intravenous contrast.

[Series 3: head without · axial · non-contrast · 0.45mm/px · z∈[-97,+23]mm · 7 of 33 slices shown, 9 images]
[im 5/33  brain]
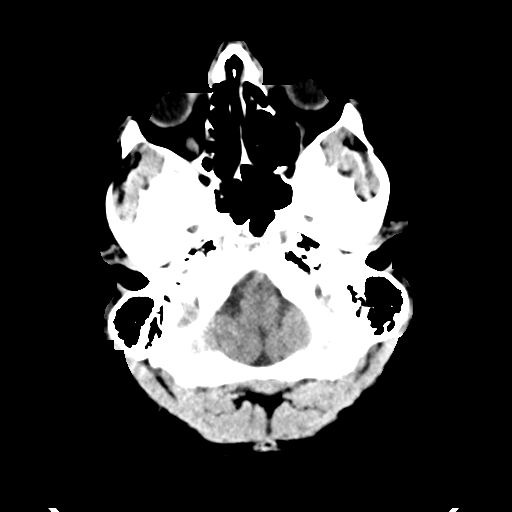
[im 5/33  bone]
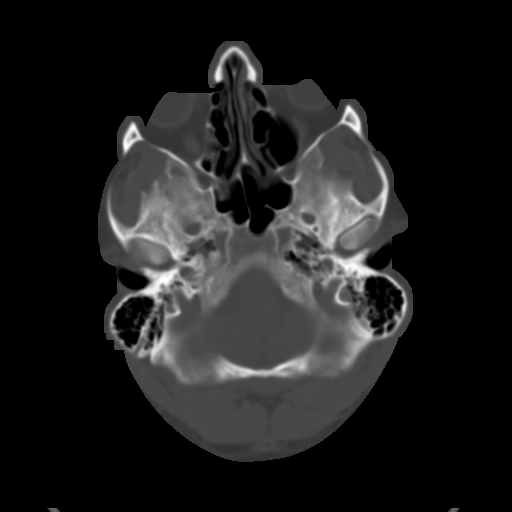
[im 9/33  brain]
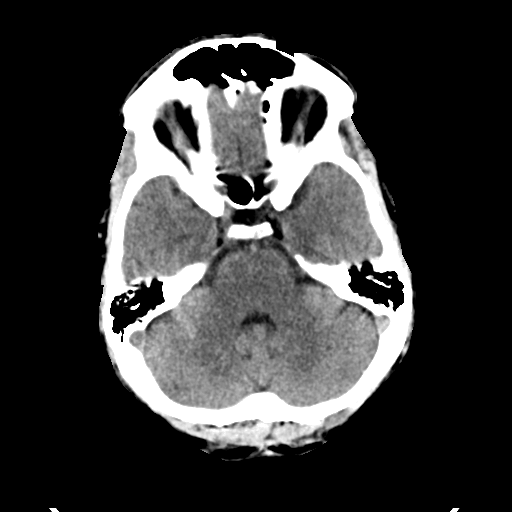
[im 13/33  brain]
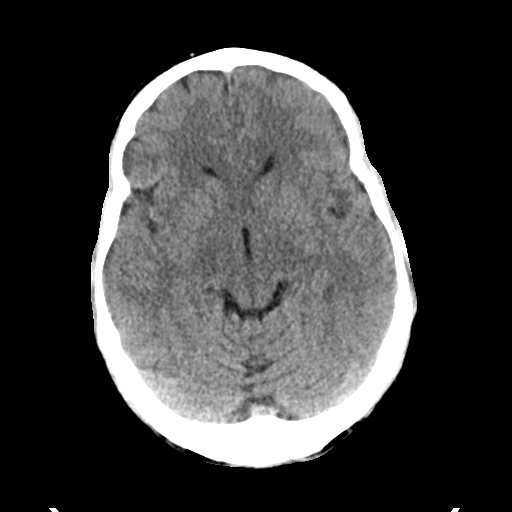
[im 17/33  brain]
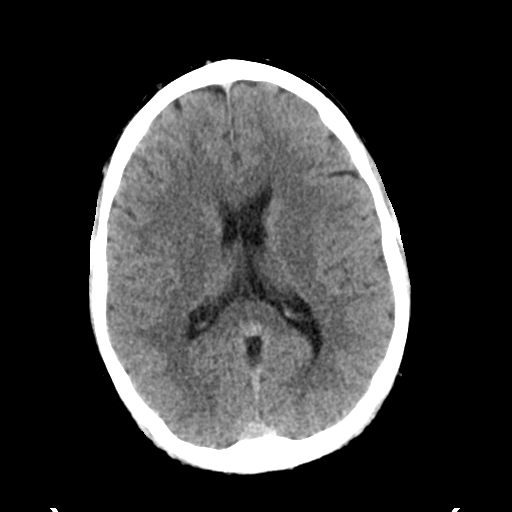
[im 21/33  brain]
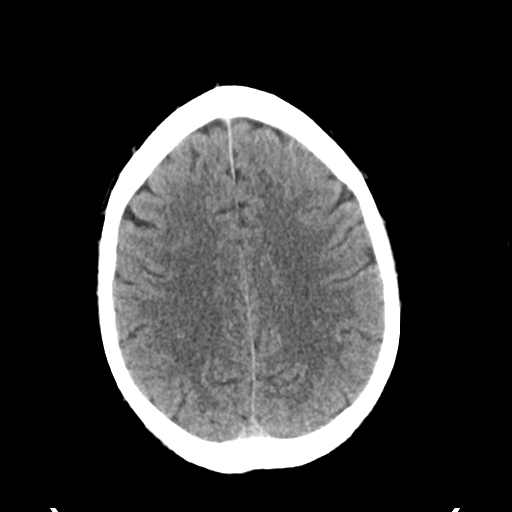
[im 21/33  bone]
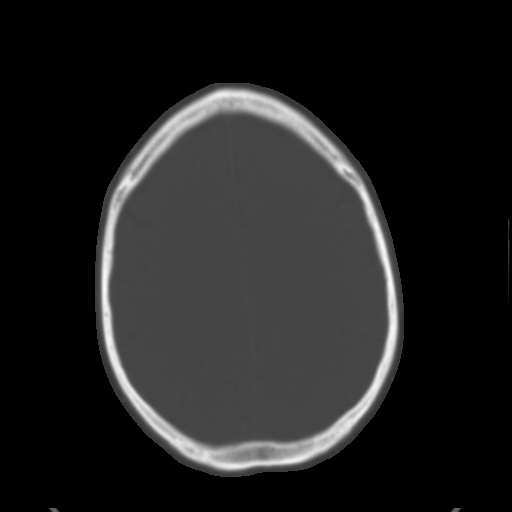
[im 25/33  brain]
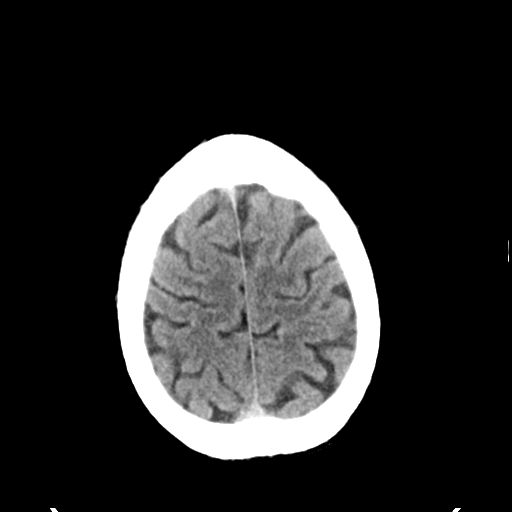
[im 29/33  brain]
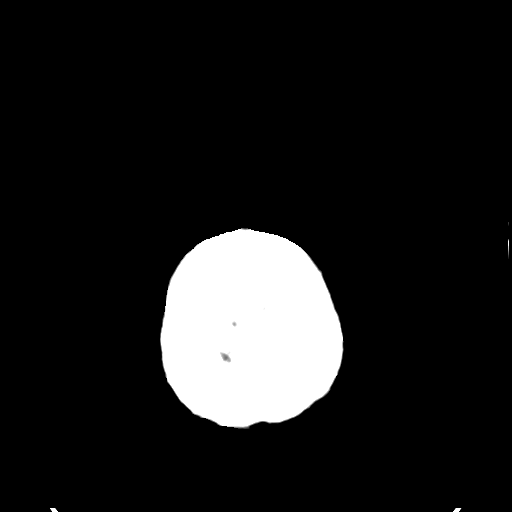

[Series 4: head bone · axial · 0.45mm/px · z∈[-101,-45]mm · 4 of 81 slices shown]
[im 9/81  bone]
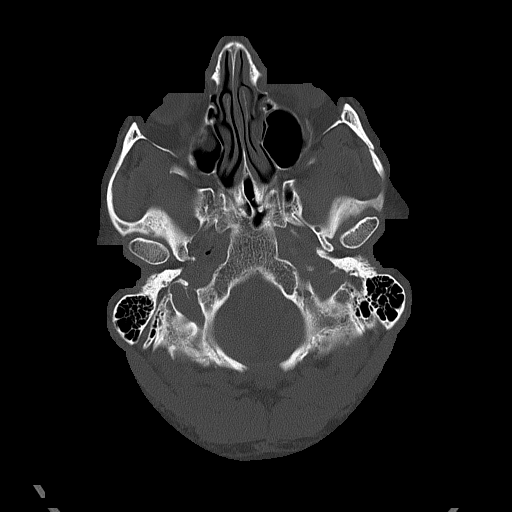
[im 17/81  bone]
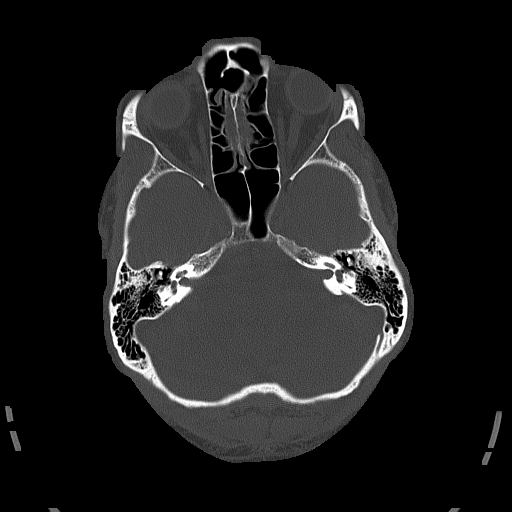
[im 25/81  bone]
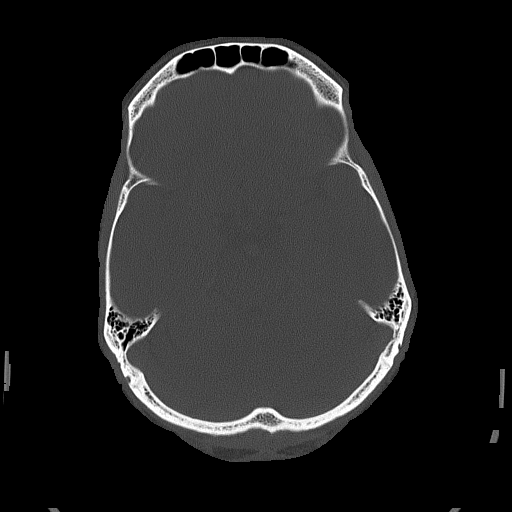
[im 37/81  bone]
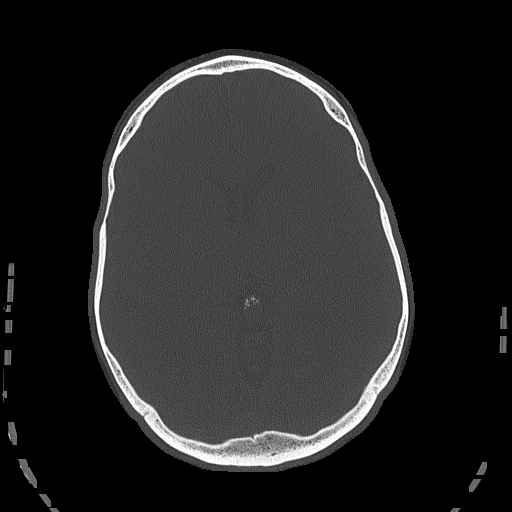

[Series 5: head without cor · coronal · non-contrast · 0.31mm/px · 3 of 71 slices shown]
[im 25/71  brain]
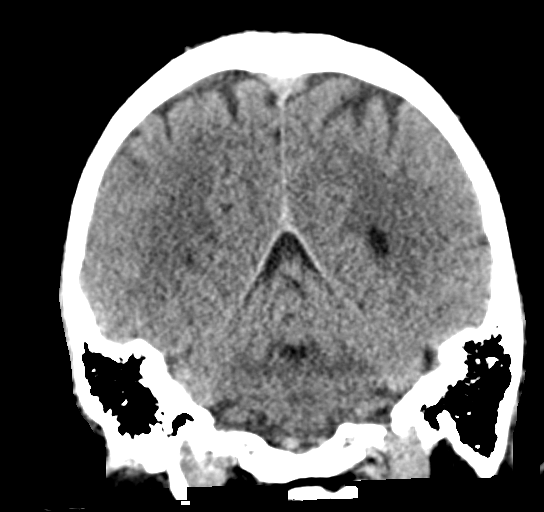
[im 32/71  brain]
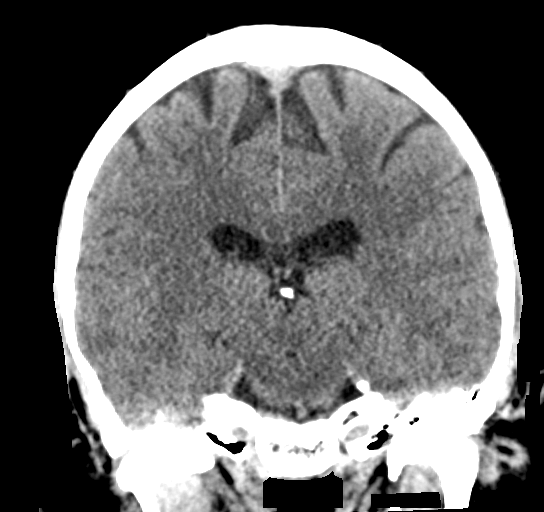
[im 39/71  brain]
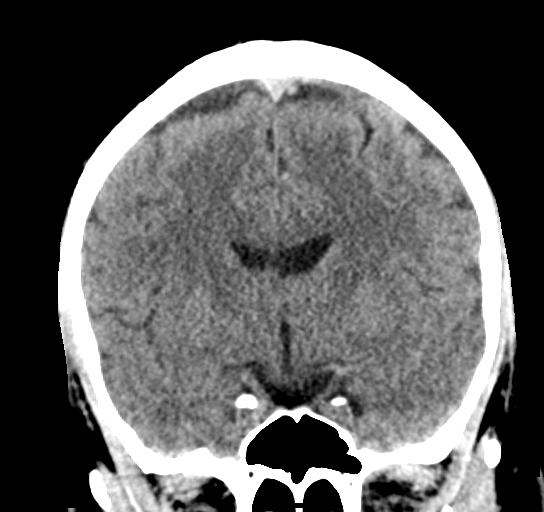

[Series 6: head without sag · sagittal · non-contrast · 0.32mm/px · 3 of 57 slices shown]
[im 19/57  brain]
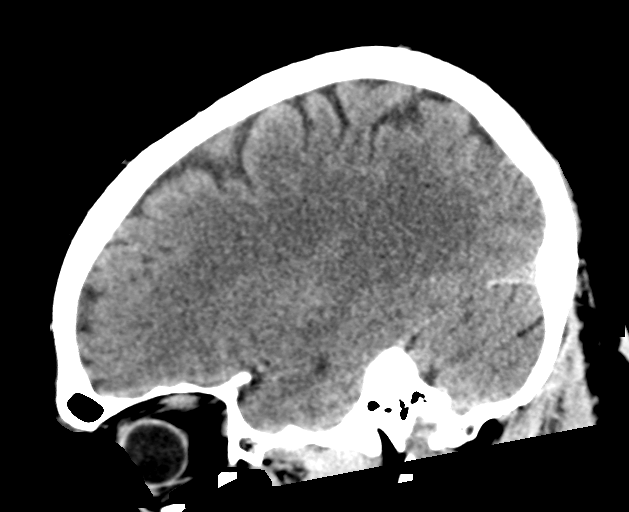
[im 29/57  brain]
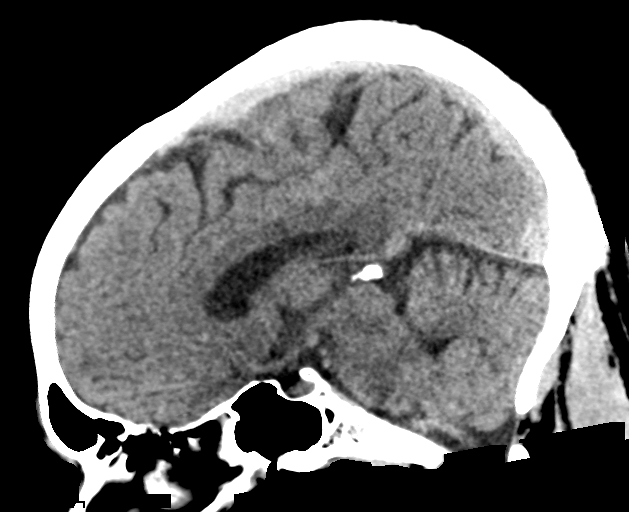
[im 38/57  brain]
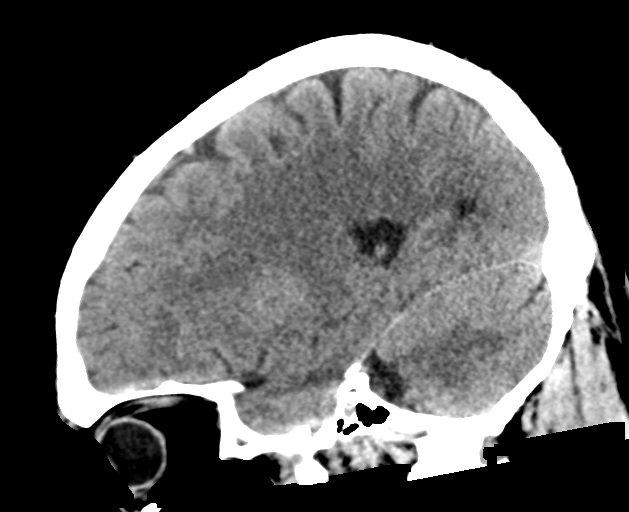

[17 of 47 positions shown; findings below may reference images not displayed]

FINDINGS: Brain: No evidence of acute infarction, hemorrhage, hydrocephalus,
extra-axial collection or mass lesion/mass effect.

Vascular: No hyperdense vessel or unexpected calcification.

Skull: Normal. Negative for fracture or focal lesion.

Sinuses/Orbits: No acute finding.

Other: None
IMPRESSION: Negative

## 2024-04-27 ENCOUNTER — Encounter: Payer: Self-pay | Admitting: *Deleted
